# Patient Record
Sex: Male | Born: 1945 | Race: Black or African American | Hispanic: No | Marital: Single | Smoking: Former smoker
Health system: Southern US, Community
[De-identification: ages and names within clinical notes are randomized; demographics above are authoritative.]

## PROBLEM LIST (undated history)

## (undated) DIAGNOSIS — I2510 Atherosclerotic heart disease of native coronary artery without angina pectoris: Secondary | ICD-10-CM

## (undated) DIAGNOSIS — C189 Malignant neoplasm of colon, unspecified: Secondary | ICD-10-CM

## (undated) DIAGNOSIS — C20 Malignant neoplasm of rectum: Secondary | ICD-10-CM

## (undated) DIAGNOSIS — I10 Essential (primary) hypertension: Secondary | ICD-10-CM

## (undated) DIAGNOSIS — I4901 Ventricular fibrillation: Secondary | ICD-10-CM

## (undated) DIAGNOSIS — C61 Malignant neoplasm of prostate: Secondary | ICD-10-CM

## (undated) HISTORY — PX: KNEE ARTHROSCOPY: SHX127

---

## 2014-09-23 DIAGNOSIS — I4901 Ventricular fibrillation: Secondary | ICD-10-CM

## 2014-09-23 DIAGNOSIS — I2510 Atherosclerotic heart disease of native coronary artery without angina pectoris: Secondary | ICD-10-CM

## 2014-09-23 HISTORY — DX: Atherosclerotic heart disease of native coronary artery without angina pectoris: I25.10

## 2014-09-23 HISTORY — DX: Ventricular fibrillation: I49.01

## 2014-10-20 HISTORY — PX: CARDIAC CATHETERIZATION: SHX172

## 2014-10-25 HISTORY — PX: CARDIAC DEFIBRILLATOR PLACEMENT: SHX171

## 2015-02-23 LAB — POCT GLUCOSE (2 HR PP)

## 2015-06-12 LAB — POCT GLUCOSE (2 HR PP)

## 2015-09-08 ENCOUNTER — Telehealth: Payer: Self-pay | Admitting: *Deleted

## 2015-09-08 NOTE — Telephone Encounter (Signed)
Oncology Nurse Navigator Documentation  Oncology Nurse Navigator Flowsheets 09/08/2015  Navigator Location [facility]  Navigator Encounter Type Introductory phone call  Referral from medical oncologist in [place] will be moving to [place] to be with family to support him. His functional status has declined. Left VM for him to call back next week with approximate date he plans to move to area to coordinate his appointment for him.

## 2015-09-11 ENCOUNTER — Telehealth: Payer: Self-pay | Admitting: *Deleted

## 2015-09-11 NOTE — Telephone Encounter (Signed)
Oncology Nurse Navigator Documentation  Oncology Nurse Navigator Flowsheets 09/11/2015  Navigator Location [facility]  Navigator Encounter Type Introductory phone call;Telephone  Telephone Outgoing Call;Appt Confirmation/Clarification--left VM for sister, Camila Li with appointment with Dr. Burr Medico on 09/19/15 at 11:00. Requested return call to confirm and provide directions/what he needs to bring. Called [facility] and spoke with christine. Patient still in hospital, but discharge is planned for Friday this week. Dr. Marlyn Corporal wants to speak with oncologist who will be seeing him and his cell # was provided. Will forward to Dr. Burr Medico.

## 2015-09-11 NOTE — Telephone Encounter (Signed)
Return call from sister, Lorriane Shire. She spoke with his physician this weekend and when he is discharged from hospital will be going to SNF for 4-6 weeks and then come to [place]. She will call back when his move to [place] date is determined.Marland KitchenMarland KitchenMarland Kitchen

## 2015-09-13 ENCOUNTER — Telehealth: Payer: Self-pay | Admitting: Hematology

## 2015-09-13 ENCOUNTER — Encounter: Payer: Self-pay | Admitting: Hematology

## 2015-09-13 NOTE — Telephone Encounter (Signed)
Scheduled Pt per RN. Per RN, Pt is aware of appt time and date

## 2015-09-19 ENCOUNTER — Ambulatory Visit: Payer: Managed Care, Other (non HMO) | Admitting: Hematology

## 2015-10-03 ENCOUNTER — Telehealth: Payer: Self-pay | Admitting: *Deleted

## 2015-10-03 NOTE — Telephone Encounter (Signed)
  Oncology Nurse Navigator Documentation  Navigator Location: [facility] (10/03/15 1618) Navigator Encounter Type: Telephone (10/03/15 1618) Telephone: Outgoing Call;Patient Update (10/03/15 1618)  spoke w/his sister who was in MD office with him in [place]. He is still in SNF and still on IV antibiotics. Last day at SNF is 10/12/15 and then will be in his home a week or so before coming to [place]. She will call about 1 week prior to his move to [place] to be seen.

## 2015-10-16 ENCOUNTER — Telehealth: Payer: Self-pay | Admitting: *Deleted

## 2015-10-16 NOTE — Telephone Encounter (Signed)
Oncology Nurse Navigator Documentation  Oncology Nurse Navigator Flowsheets 10/16/2015  Navigator Location [facility]  Navigator Encounter Type Telephone  Telephone Incoming Call  Sister, Michael Wade called to report he is having his last MD appointment in [place] today. Plans to move to [facility] and live with her on 10/19/15. He will also need a PCP--she agrees to have him seen at [facility]. Informed her that navigator will help this referral go through for her. She is in process of getting his [place] [employer] transferred to [facility] [employer] do anything unless he is present to tell them it is OK to talk with her. Suggested she work on getting a Health are POA done when he comes in to be seen-will have CSW help with this. Informed her that navigator will call her with the new appointment. Will have him seen here 1st week of May. Sent fax to [facility] fax [phone] requesting records since last visit 09/05/15 be faxed here (prior records were received earlier).

## 2015-10-17 ENCOUNTER — Telehealth: Payer: Self-pay | Admitting: *Deleted

## 2015-10-17 NOTE — Telephone Encounter (Signed)
  Oncology Nurse Navigator Documentation  Navigator Location: [facility] (10/17/15 1114) Navigator Encounter Type: Telephone (10/17/15 1114) Telephone: Lahoma Crocker Call;Appt Confirmation/Clarification (10/17/15 1114)  Called sister, Marletta Lor with appointment with Dr. Benay Spice on 10/24/15 at 2 pm. Arrive at 1:45 pm. He saw his infectious disease physician on 4/24 and sees his PCP today. She has all his records with her and will bring on 10/24/15. Requested she obtain a CD of his last PET and CT scan to bring with her. She will need assistance getting him with an ID physician, cardiologist (asking for Peter [place]) and a new PCP with [facility]. Will send message to CSW regarding healthcare POA request when he is here next week.

## 2015-10-23 ENCOUNTER — Telehealth: Payer: Self-pay | Admitting: *Deleted

## 2015-10-23 NOTE — Telephone Encounter (Signed)
Call from sister requesting fax # for [facility] physician to fax his records. Provided [phone] to send records from [place].

## 2015-10-24 ENCOUNTER — Telehealth: Payer: Self-pay | Admitting: Oncology

## 2015-10-24 ENCOUNTER — Encounter: Payer: Self-pay | Admitting: Oncology

## 2015-10-24 ENCOUNTER — Ambulatory Visit (HOSPITAL_BASED_OUTPATIENT_CLINIC_OR_DEPARTMENT_OTHER): Payer: Managed Care, Other (non HMO) | Admitting: Oncology

## 2015-10-24 ENCOUNTER — Encounter: Payer: Self-pay | Admitting: *Deleted

## 2015-10-24 ENCOUNTER — Ambulatory Visit (HOSPITAL_BASED_OUTPATIENT_CLINIC_OR_DEPARTMENT_OTHER): Payer: Managed Care, Other (non HMO)

## 2015-10-24 VITALS — BP 118/70 | HR 103 | Temp 98.1°F | Resp 18 | Ht 71.0 in | Wt 112.5 lb

## 2015-10-24 DIAGNOSIS — C7801 Secondary malignant neoplasm of right lung: Secondary | ICD-10-CM

## 2015-10-24 DIAGNOSIS — C20 Malignant neoplasm of rectum: Secondary | ICD-10-CM

## 2015-10-24 DIAGNOSIS — C61 Malignant neoplasm of prostate: Secondary | ICD-10-CM | POA: Diagnosis not present

## 2015-10-24 DIAGNOSIS — I469 Cardiac arrest, cause unspecified: Secondary | ICD-10-CM

## 2015-10-24 DIAGNOSIS — C7800 Secondary malignant neoplasm of unspecified lung: Principal | ICD-10-CM

## 2015-10-24 DIAGNOSIS — I4901 Ventricular fibrillation: Secondary | ICD-10-CM

## 2015-10-24 DIAGNOSIS — I429 Cardiomyopathy, unspecified: Secondary | ICD-10-CM

## 2015-10-24 LAB — CBC WITH DIFFERENTIAL/PLATELET
BASO%: 0.9 % (ref 0.0–2.0)
BASOS ABS: 0.1 10*3/uL (ref 0.0–0.1)
EOS ABS: 0.8 10*3/uL — AB (ref 0.0–0.5)
EOS%: 7.9 % — AB (ref 0.0–7.0)
HEMATOCRIT: 32.1 % — AB (ref 38.4–49.9)
HEMOGLOBIN: 10.6 g/dL — AB (ref 13.0–17.1)
LYMPH#: 1.8 10*3/uL (ref 0.9–3.3)
LYMPH%: 17.7 % (ref 14.0–49.0)
MCH: 32.7 pg (ref 27.2–33.4)
MCHC: 33 g/dL (ref 32.0–36.0)
MCV: 99.1 fL — AB (ref 79.3–98.0)
MONO#: 0.8 10*3/uL (ref 0.1–0.9)
MONO%: 8.4 % (ref 0.0–14.0)
NEUT#: 6.5 10*3/uL (ref 1.5–6.5)
NEUT%: 65.1 % (ref 39.0–75.0)
PLATELETS: 323 10*3/uL (ref 140–400)
RBC: 3.24 10*6/uL — ABNORMAL LOW (ref 4.20–5.82)
RDW: 16.9 % — AB (ref 11.0–14.6)
WBC: 10 10*3/uL (ref 4.0–10.3)

## 2015-10-24 LAB — COMPREHENSIVE METABOLIC PANEL
ALBUMIN: 3.3 g/dL — AB (ref 3.5–5.0)
ALK PHOS: 156 U/L — AB (ref 40–150)
ALT: 14 U/L (ref 0–55)
AST: 23 U/L (ref 5–34)
Anion Gap: 9 mEq/L (ref 3–11)
BUN: 25.3 mg/dL (ref 7.0–26.0)
CALCIUM: 10 mg/dL (ref 8.4–10.4)
CHLORIDE: 106 meq/L (ref 98–109)
CO2: 24 mEq/L (ref 22–29)
Creatinine: 1.1 mg/dL (ref 0.7–1.3)
EGFR: 83 mL/min/{1.73_m2} — ABNORMAL LOW (ref >90)
Glucose: 89 mg/dl (ref 70–140)
POTASSIUM: 4.3 meq/L (ref 3.5–5.1)
Sodium: 139 mEq/L (ref 136–145)
Total Bilirubin: <0.3 mg/dL (ref 0.20–1.20)
Total Protein: 8.1 g/dL (ref 6.4–8.3)

## 2015-10-24 NOTE — Telephone Encounter (Signed)
per pof to sch pt appt-sent back ot lab-cld pt w/appt time & adv need to get contrast for scan

## 2015-10-24 NOTE — Progress Notes (Signed)
[facility] New Patient Consult   Referring MD: Lavella Lemons, [address] [place] [address]   Michael Wade 70 y.o.  1946-03-30    Reason for Referral:  Rectal cancer   HPI:  Michael Wade was diagnosed with metastatic rectal cancer in February 2016. A colonoscopy on 08/06/2014 revealed a circumferential mass extending from 7-18 cm from the anus and a biopsy confirmed invasive adenocarcinoma. A staging PET scan 09/30/2014 revealed an FDG avid upper rectal tumor including soft tissue  In the adjacent mesorectal fat, FDG avid [peritoneal lymph node suspicious for metastatic lymphadenopathy, and an FDG avid right upper lung groundglass nodule. A CT-guided biopsy of the right upper lung nodule on 10/12/2014 confirmed metastatic colorectal cancer.  He was treated with FOLFOX/Avastin for 12 cycles with oxaliplatin discontinued after cycle 10 due to neuropathy. A PET scan after 6 cycles on 02/23/2015 revealed a clinical response with near resolution of the right lung nodule and a local response. A restaging PET scan after cycle 12 revealed  Continued improvement in the rectal wall mass, hypermetabolic ridge peritoneal lymphadenopathy no longer seen, and hypermetabolic activity in the right upper lung nodule had resolved.   He declined surgery. Xeloda/radiation was recommended. He completed one cycle of Xeloda beginning 07/12/2015 and never began radiation.  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 He was seen by infectious disease and completed 6 weeks of vancomycin/ Zosyn, completed 10/14/2015. He remains on Flagyl. Back pain he experienced prior to the antibiotics has resolved.  He is also being treated for early stage prostate cancer with Lupron. He is receiving Lupron on a 3 month schedule, last given 08/07/2015.  Michael Wade  Has a history of coronary artery disease and had a ventricular fibrillation  arrest 10/19/2014. A defibrillator is in place. His ejection fraction is reported at 15%.  Michael Wade relocated to [facility]  On 10/19/2015 to live with his mother and sister.   He has been maintained off of specific therapy for rectal cancer since January.   Past medical history: 1.  Stage IV rectal cancer diagnosed in February 2016 2.   Coronary artery disease 3.   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   Diminished ejection fraction 5.    Hypertension 6.    Prostate cancer, 09/05/2014, Gleason 7, maintained on every three-month Lupron 7.    Chemotherapy-induced peripheral neuropathy 8.     Iron deficiency anemia 9.     History of leukocytosis 10.   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  Left knee surgery Medications: Reviewed  Allergies: No Known Allergies  Family history:  Is tolerating a colon cancer at age 56, paternal uncle had pancreas cancer in his 52s, his paternal grandmother had a GYN malignancy in her 12s  Social History:    he lives with his mother and sister in [place]. He quit smoking cigarettes a few years ago. He has a history of mild alcohol use. He was transfused with packed red blood cells with the rectal cancer diagnosis. No risk factor for HIV or hepatitis.    ROS:   Positives include: 20 pound weight loss over the past year, rectal bleeding, occasional headache, numbness in the fingers that interferes with activity, back pain resolved following antibiotics  A complete ROS  was otherwise negative.  Physical Exam:  Blood pressure 118/70, pulse 103, temperature 98.1 F (36.7 C), temperature source Oral, resp. rate 18, height 5\' 11"  (1.803 m), weight 112 lb 8 oz (51.03 kg), SpO2 100 %.  HEENT:  Oropharynx without visible mass, neck without mass Lungs:  Clear  bilaterally Cardiac:  Regular rate and rhythm, left upper chest defibrillator in place Abdomen:  No hepatomegaly, no mass, nontender GU:  Testes without mass  Vascular:  Leg edema Lymph nodes:  No cervical, supraclavicular, axillary, or inguinal nodes Neurologic:  Alert and oriented, the motor exam appears intact in the upper and lower extremities Skin:  Mild hyperpigmented eczema-like areas at the lower back Musculoskeletal:  No spine tenderness    Imaging:   as per history of present illness   Assessment/Plan:   1.  rectal cancer, stage IV, a lung metastasis and  Metastatic lymphadenopathy on initial staging), status post  A rectal biopsy 08/07/2015 , CEA 2.8 on 09/22/2014   colonoscopy 08/06/2014 confirmed a mass extending from 7-18 cm from the anus   MRI 08/19/2014 revealed a circumferential mass at the rectum, no definite extension beyond the serosal margin, no perirectal adenopathy   PET scan 09/30/2014 with an FDG avid upper rectal mass, extension into adjacent mesorectal fat versus mesorectal lymphadenopathy, FDG avid left retroperitoneal lymph nodes and an FDG avid  Right upper lung groundglass nodule measuring 1.9 x 1.1 cm.   CT-guided biopsy of the right lung nodule 10/12/2014 confirmed metastatic colorectal cancer   treatment with FOLFOX/Avastin for 12 cycles completed December 2016, oxaliplatin deleted after cycle 10 secondary to neuropathy   restaging PET scan 02/23/2015 after 6 cycles revealed local regional response and the right upper lobe nodule had almost completely resolved   PET scan 06/12/2015 after cycle 12 revealed continued improvement in the rectal mass, hypermetabolic Retroperitoneal lymphadenopathy no longer seen, hyper metabolic activity at right lung nodule had resolved   one cycle of Xeloda January 2017   2.      Prostate cancer, PSA 11.3 09/05/2014          Prostate biopsy 09/05/2014 confirmed a Gleason 7 tumor   maintained on every  three-month Lupron starting 11/23/2014    3.  Iron deficiency anemia  4.  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   History of CAD, EF 15%    6.    Hypertension 7.    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    Family history of colon and pancreas cancer    Disposition:    Michael Wade has metastatic rectal cancer. He has completed 12 cycles of FOLFOX/Avastin with a clinical response. He has been maintained off of specific therapy since January.  He continues to have rectal bleeding, but he is otherwise asymptomatic from the rectal cancer at present. He is completing antibiotic therapy for treatment of a lumbar epidural abscess.  I discussed treatment options with Michael Wade and his family. He could potentially undergo an attempt at curative therapy with chemotherapy/radiation, rectal surgery, and treatment directed at the isolated lung lesion. However the chance of cure is very small given the initial presentation with metastatic lymphadenopathy and a lung lesion. He is most likely not a surgical candidate based on his cardiac history.  The plan is to follow him off therapy for now. He will undergo restaging CT scans and return for an  office visit in 2 weeks. We will  decide on whether to continue Lupron when he returns in 2 weeks. I will present his case at the GI tumor conference.   we will make a referral to cardiology to establish care in [place].  Approximately 50 minutes were spent with the patient today. The majority of the time was used for counseling and coordination of care.  Betsy Coder, MD  10/24/2015, 3:30 PM

## 2015-10-24 NOTE — Progress Notes (Signed)
Oncology Nurse Navigator Documentation  Oncology Nurse Navigator Flowsheets 10/24/2015  Navigator Location [facility]  Navigator Encounter Type Initial MedOnc  Telephone -  Patient Visit Type MedOnc;Initial  Treatment Phase Other--transfer care from [place] to [facility]  Barriers/Navigation Needs Coordination of Care;Education  Education Accessing Care/ Finding Providers;Preparing for Upcoming Surgery/ Treatment  Interventions Referrals;Coordination of Care;Education Method  Referrals Social Work for [place] to be completed;Other--cardiology  Coordination of Care Radiology--fax ROI to hospitals in [place] for scans on CD  Education Method Verbal;Teach-back  Acuity Level 4  Time Spent with Patient 47  Escorted his sister to financial advocate to ensure his insurance is in order. Sent message to managed care to precert the CT scan so it can be ordered. Will add to GI Conference on 11/08/15 to compare scan here to priors in [place]. Noted last PAC flush was 10/14/15 with his last antibiotic dose and last Lupron injection was 08/07/15 (will be due on next May visit). Faxed ROI to [facility] [phone] for CT 08/06/14 of C/A/P; ROI to [facility] [phone] for CT 09/05/15 of L-spine; [facility] [phone] for PET scans 09/30/14 and 06/12/15 to be sent on CD to Dr. Benay Spice. Met with patient, his sister and his mother at new patient appointment today. Working on getting a CT scan and compare with last ones in [place], then present case in Tumor Board on 5/17. Will refer to Dr. Lisbeth Renshaw for RT if indicated. Made referral to cardiology.

## 2015-10-25 ENCOUNTER — Encounter: Payer: Self-pay | Admitting: *Deleted

## 2015-10-25 LAB — CEA: CEA1: 2 ng/mL (ref 0.0–4.7)

## 2015-10-25 NOTE — Progress Notes (Signed)
[place] Social Work  Clinical Social Work was referred by GI navigator to review and complete healthcare advance directives.  Clinical Social Worker met with patient, patient's sister, and patient's mother in [facility] lobby.  The patient designated sister, Michael Wade, as their primary healthcare agent and mother, Michael Wade, as their secondary agent.  Patient also completed healthcare living will.  Patient does not desire life prolonging measure in the end of life scenarios identified.  Advanced Directives 10/25/2015  Does patient have an advance directive? Yes  Type of Paramedic of [place];Living will  Does patient want to make changes to advanced directive? Yes - information given  Copy of advanced directive(s) in chart? Yes  Would patient like information on creating an advanced directive? Yes - Educational materials given    Clinical Social Worker notarized documents and made copies for patient/family. Clinical Social Worker will send documents to medical records to be scanned into patient's chart. Clinical Social Worker encouraged patient/family to contact with any additional questions or concerns.  Michael Wade, MSW, [place] Worker [facility] [phone]

## 2015-10-26 ENCOUNTER — Encounter: Payer: Self-pay | Admitting: *Deleted

## 2015-10-26 NOTE — Progress Notes (Signed)
  Oncology Nurse Navigator Documentation  Navigator Location: [facility] (10/26/15 1704) Navigator Encounter Type: Letter/Fax/Email (10/26/15 1704)  Received copy of CT C/A/P report dated 08/06/14 from [facility] along with CD. Will transport to radiology when other CD's are received.

## 2015-10-27 ENCOUNTER — Telehealth: Payer: Self-pay | Admitting: *Deleted

## 2015-10-27 NOTE — Telephone Encounter (Signed)
Called cardiology practice of [place]: They have referral and tried to call patient with no return call. Informed scheduler that they need to call the emergency contact, his sister who is his POA. Was informed by managed care at [facility] that patient's insurance will only approve his CT scan if done at [facility] PA says chest--managed care will f/u to ensure abd/pelvis are also approved. Will then reschedule his scan from [facility] to [facility].

## 2015-10-31 ENCOUNTER — Telehealth: Payer: Self-pay | Admitting: *Deleted

## 2015-10-31 NOTE — Telephone Encounter (Signed)
"  Someone called me in reference to my brother Michael Wade.  Not sure why but I do know he has an appointment coming up.  Could staff call me not him because he is sleep and doesn't know what's going on when he is awakened." Advised his CT scans need to be performed at [facility].  Managed care is working on prior authorizations and will call her with new appointment dates, times and location information.  Updated demographics for her to receive all calls.

## 2015-10-31 NOTE — Telephone Encounter (Signed)
[facility] again requesting they contact his sister for appointment. Representative said she will have Melissa call sister today.

## 2015-10-31 NOTE — Telephone Encounter (Signed)
Return call from [place]: Made her aware that his insurance plan requires him to go to [facility] and not use a [place] facility. Provided his scan appointment for 11/03/15 at 0730/0900-NPO 4 hours prior and they will provide contrast upon arrival. Address : [facility] [place] [address] [address] [phone] Fax [phone]. She will obtain a CD and bring to office on that Friday or Monday. They are to enter front main entrance, then turn left to patient registration. Faxed orders/lab results to [facility] with order to provide patient CD to have loaded in our system.

## 2015-11-01 ENCOUNTER — Inpatient Hospital Stay: Admission: RE | Admit: 2015-11-01 | Payer: Self-pay | Source: Ambulatory Visit

## 2015-11-01 ENCOUNTER — Inpatient Hospital Stay
Admission: RE | Admit: 2015-11-01 | Discharge: 2015-11-01 | Disposition: A | Discharge status: Home / Self Care | Payer: Self-pay | Source: Ambulatory Visit | Attending: Oncology | Admitting: Oncology

## 2015-11-01 ENCOUNTER — Other Ambulatory Visit: Payer: Self-pay | Admitting: Oncology

## 2015-11-01 DIAGNOSIS — C7800 Secondary malignant neoplasm of unspecified lung: Principal | ICD-10-CM

## 2015-11-01 DIAGNOSIS — C801 Malignant (primary) neoplasm, unspecified: Secondary | ICD-10-CM

## 2015-11-01 DIAGNOSIS — C20 Malignant neoplasm of rectum: Secondary | ICD-10-CM

## 2015-11-03 ENCOUNTER — Telehealth: Payer: Self-pay | Admitting: *Deleted

## 2015-11-03 NOTE — Telephone Encounter (Signed)
Oncology Nurse Navigator Documentation  Oncology Nurse Navigator Flowsheets 11/03/2015  Navigator Location [facility]  Navigator Encounter Type Telephone  Telephone Incoming Call;Diagnostic Results  Patient Visit Type -  Treatment Phase -  Barriers/Navigation Needs -  Education -  Interventions -  Referrals -  Coordination of Care -Sister has his CD from [facility] today and asking where to deliver it to? Instructed her to bring to [place] on Monday and give to reception desk to forward to the navigator. [place] CDs have been loaded into system and returned to [employer] today.  Education Method -  Acuity -  Time Spent with Patient 15

## 2015-11-06 ENCOUNTER — Other Ambulatory Visit: Payer: Self-pay | Admitting: Oncology

## 2015-11-06 ENCOUNTER — Inpatient Hospital Stay
Admission: RE | Admit: 2015-11-06 | Discharge: 2015-11-06 | Disposition: A | Discharge status: Home / Self Care | Payer: Self-pay | Source: Ambulatory Visit | Attending: Oncology | Admitting: Oncology

## 2015-11-06 DIAGNOSIS — C801 Malignant (primary) neoplasm, unspecified: Secondary | ICD-10-CM

## 2015-11-07 ENCOUNTER — Ambulatory Visit (HOSPITAL_COMMUNITY): Payer: Managed Care, Other (non HMO)

## 2015-11-08 ENCOUNTER — Telehealth: Payer: Self-pay | Admitting: Pharmacist

## 2015-11-08 ENCOUNTER — Telehealth: Payer: Self-pay | Admitting: Oncology

## 2015-11-08 ENCOUNTER — Inpatient Hospital Stay
Admission: RE | Admit: 2015-11-08 | Discharge: 2015-11-08 | Disposition: A | Discharge status: Home / Self Care | Payer: Self-pay | Source: Ambulatory Visit | Attending: Oncology | Admitting: Oncology

## 2015-11-08 ENCOUNTER — Other Ambulatory Visit: Payer: Self-pay | Admitting: Oncology

## 2015-11-08 ENCOUNTER — Ambulatory Visit (HOSPITAL_BASED_OUTPATIENT_CLINIC_OR_DEPARTMENT_OTHER): Payer: Managed Care, Other (non HMO) | Admitting: Oncology

## 2015-11-08 ENCOUNTER — Other Ambulatory Visit: Payer: Self-pay | Admitting: *Deleted

## 2015-11-08 ENCOUNTER — Telehealth: Payer: Self-pay | Admitting: *Deleted

## 2015-11-08 VITALS — BP 121/71 | HR 89 | Temp 98.5°F | Resp 16 | Ht 71.0 in | Wt 108.9 lb

## 2015-11-08 DIAGNOSIS — C7800 Secondary malignant neoplasm of unspecified lung: Secondary | ICD-10-CM

## 2015-11-08 DIAGNOSIS — C61 Malignant neoplasm of prostate: Secondary | ICD-10-CM

## 2015-11-08 DIAGNOSIS — D509 Iron deficiency anemia, unspecified: Secondary | ICD-10-CM | POA: Diagnosis not present

## 2015-11-08 DIAGNOSIS — C801 Malignant (primary) neoplasm, unspecified: Secondary | ICD-10-CM

## 2015-11-08 DIAGNOSIS — C20 Malignant neoplasm of rectum: Secondary | ICD-10-CM | POA: Diagnosis not present

## 2015-11-08 MED ORDER — MEGESTROL ACETATE 625 MG/5ML PO SUSP: 625.0000 mg | Freq: Two times a day (BID) | ORAL | Status: DC

## 2015-11-08 MED ORDER — CAPECITABINE 500 MG PO TABS: ORAL_TABLET | ORAL | Status: DC

## 2015-11-08 NOTE — Telephone Encounter (Signed)
per pof to sch pt appt-gave pt copy of avs °

## 2015-11-08 NOTE — Telephone Encounter (Signed)
Oncology Nurse Navigator Documentation  Oncology Nurse Navigator Flowsheets 11/08/2015  Navigator Location [facility]  Navigator Encounter Type Follow-up Appt  Telephone -  Patient Visit Type MedOnc  Treatment Phase Pre-Tx/Tx Discussion--Xeloda/RT  Barriers/Navigation Needs Education;Coordination of Care  Education Understanding Cancer/ Treatment Options;Pain/ Symptom Management;Preparing for Upcoming Surgery/ Treatment  Interventions Referrals;Coordination of Care;Education Method  Referrals -  Coordination of Care Appts--message to Dr. Ida Rogue scheduler to request appointment early next week-5/24 if possible (day of chemo class)  Education Method Verbal;Written;Teach-back  Acuity Level 2  Time Spent with Patient 10  Explained the physiology behind why he is having loose stools due to his rectal tumor is causing some blockage and looser stool is going around the obstruction. CT scan shows a moderate stool burden in colon. He will start MiraLax 17 grams daily. Also started on Megace bid for appetite-was informed of increase blood clot risk with this medication. MD will resume his Lupron when he comes in for chemo class next week. [facility] to request hard copy of the CT report. CD has been loaded into system. Patient's case was discussed in tumor board today with recommendations for XRT/Xeloda. High surgical risk due to cardiac issues. Would need cardiac clearance prior to surgery.

## 2015-11-08 NOTE — Progress Notes (Signed)
[facility] OFFICE PROGRESS NOTE   Diagnosis: Rectal cancer, prostate cancer  INTERVAL HISTORY:   Michael Wade returns as scheduled. He has intermittent abdominal pain. He is not taking pain medication. He reports anorexia. He has frequent loose stools. He reports having up to 5 stools during the night with some incontinence. CTs the chest, abdomen, and pelvis on 11/03/2015 No vomiting of revealed clustered peripheral right upper lobe nodules, subcentimeter low attenuation lesions in the liver consistent with cyst. A large obstructing mass was noted at the rectum extending into the presacral soft tissue with potential for a small associated abscess. Severe fecal retention in the proximal colon. No adenopathy. No evidence of metastatic disease in the abdomen or pelvis.  Objective:  Vital signs in last 24 hours:  Blood pressure 121/71, pulse 89, temperature 98.5 F (36.9 C), temperature source Oral, resp. rate 16, height 5\' 11"  (1.803 m), weight 108 lb 14.4 oz (49.397 kg), SpO2 100 %.    Resp: Lungs clear bilaterally Cardio: Regular rate and rhythm GI: Nondistended, nontender, no mass, no hepatomegaly Vascular: No leg edema   Portacath/PICC-without erythema  Lab Results:  Lab Results  Component Value Date   WBC 10.0 10/24/2015   HGB 10.6* 10/24/2015   HCT 32.1* 10/24/2015   MCV 99.1* 10/24/2015   PLT 323 10/24/2015   NEUTROABS 6.5 10/24/2015     Lab Results  Component Value Date   CEA1 2.0 10/24/2015    Imaging:  As per history of present illness, images reviewed at the GI tumor conference 11/08/2015   Medications: I have reviewed the patient's current medications.  Assessment/Plan: 1. rectal cancer, stage IV, a lung metastasis and Metastatic lymphadenopathy on initial staging), status post A rectal biopsy 08/07/2015 , CEA 2.8 on 09/22/2014  colonoscopy 08/06/2014 confirmed a mass extending from 7-18 cm from the anus  MRI 08/19/2014 revealed a  circumferential mass at the rectum, no definite extension beyond the serosal margin, no perirectal adenopathy  PET scan 09/30/2014 with an FDG avid upper rectal mass, extension into adjacent mesorectal fat versus mesorectal lymphadenopathy, FDG avid left retroperitoneal lymph nodes and an FDG avid Right upper lung groundglass nodule measuring 1.9 x 1.1 cm.  CT-guided biopsy of the right lung nodule 10/12/2014 confirmed metastatic colorectal cancer  treatment with FOLFOX/Avastin for 12 cycles completed December 2016, oxaliplatin deleted after cycle 10 secondary to neuropathy  restaging PET scan 02/23/2015 after 6 cycles revealed local regional response and the right upper lobe nodule had almost completely resolved  PET scan 06/12/2015 after cycle 12 revealed continued improvement in the rectal mass, hypermetabolic Retroperitoneal lymphadenopathy no longer seen, hyper metabolic activity at right lung nodule had resolved  one cycle of Xeloda January 2017  CTs chest, abdomen, and pelvis 11/03/2015-clustered peripheral right upper lobe nodules, no other evidence of metastatic disease, rectal mass with large colonic stool burden, 1.6 and meter presacral rim-enhancing fluid collection   2. Prostate cancer, PSA 11.3 09/05/2014   Prostate biopsy 09/05/2014 confirmed a Gleason 7 tumor  maintained on every three-month Lupron starting 11/23/2014   3. Iron deficiency anemia  4. 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 History of CAD, EF 15%  6. Hypertension 7. 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 Family history of colon and pancreas cancer     Disposition:  Mr. [name] appears stable. His case was presented at the GI tumor conference  earlier today. The consensus opinion is to proceed with  radiation/Xeloda for treatment of the primary tumor and consider SRS to the biopsy-proven lung metastasis.  He will most likely not be a candidate for resection of the primary tumor secondary to his cardiac history and underlying emphysema.  I reviewed the potential toxicities associated with Xeloda including a chance for nausea, mucositis, diarrhea, rash, hyperpigmentation, sun sensitivity, cardiac toxicity, and hand/foot syndrome. He agrees to proceed.  Mr. [name] will be referred to Dr. Lisbeth Renshaw for radiation planning. We anticipate he will start treatment on 11/21/2015.  He will return for an office and lab visit in approximately 2 weeks after starting therapy.  Mr. [name] is scheduled to see cardiology 11/24/2015. He will begin Megace as an appetite stimulant. He will begin MiraLAX and an attempt to regulate his bowel habits.  Mr. [name] will continue every three-month Lupron for treatment of the prostate cancer.  Betsy Coder, MD  11/08/2015  12:10 PM

## 2015-11-08 NOTE — Telephone Encounter (Signed)
11/08/15: New Rx for Xeloda faxed to [facility]

## 2015-11-08 NOTE — Telephone Encounter (Signed)
Pt insurance requires an autho, contacted referring provider in [place] and was provided the PCP in [place] (Dr. Linward Foster [phone] lt mess for referral dept.

## 2015-11-08 NOTE — Patient Instructions (Signed)
Your colon has a lot of stool per CT scan. Start taking MiraLax 17 grams daily. Start Megace 5 cc twice daily for appetite Return next week for chemo class and to see Dr. Lisbeth Renshaw for radiation therapy planning

## 2015-11-09 ENCOUNTER — Other Ambulatory Visit: Payer: Self-pay | Admitting: *Deleted

## 2015-11-09 ENCOUNTER — Encounter: Payer: Self-pay | Admitting: Pharmacist

## 2015-11-09 ENCOUNTER — Telehealth: Payer: Self-pay | Admitting: Oncology

## 2015-11-09 MED ORDER — MEGESTROL ACETATE 400 MG/10ML PO SUSP: 200.0000 mg | Freq: Two times a day (BID) | ORAL | Status: DC

## 2015-11-09 NOTE — Progress Notes (Signed)
Needed to move his chemo class from 5/24 due to being seen by Dr. Lisbeth Renshaw. POF to scheduler to have class and Lupron injection on 5/25 or 5/26. Requested appointment be called to his sister.

## 2015-11-09 NOTE — Progress Notes (Signed)
11/09/15: New Rx for Xeloda faxed to [facility] per insurance requirements. Johny Drilling, PharmD

## 2015-11-09 NOTE — Telephone Encounter (Signed)
Left message to inform patient of 5/25 appt at 10 am. Was unable to reach his sister or leave a message

## 2015-11-09 NOTE — Telephone Encounter (Signed)
Received call from April, Pharmacist at [facility]: Megace ES requires prior auth. Per MD: Rx should be for Megace 40mg /mL take 5 ml BID. New Rx sent electronically. April made aware, she will run new prescription.

## 2015-11-10 ENCOUNTER — Telehealth: Payer: Self-pay

## 2015-11-10 NOTE — Telephone Encounter (Signed)
[employer] prior authorization dept called with 2 questions concerning the megace prescription. 1) is the rx for aids, cancer or cystic fibrosis. This rn answered d/t cancer. 2) has the pt tried dronabinol? No note seen for prior dronabinol. Christella Scheuermann will send information to be considered. The order reference number is [id number].  There can be a 72 hour turn around time.

## 2015-11-10 NOTE — Progress Notes (Signed)
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 Loss  Over past year , 7 lbs in the last week, appetite poor but has started to improve with megace  Bowel/Bladder complaints, if any: bladder normal, blood in stool ,diarrhea  Nausea / Vomiting, if any: No  Pain issues, if any: abdomen 3/4 on 10 scale  Any blood per rectum:   Yes, having diarrhea but to take miralax  Has blockage  SAFETY ISSUES: Yes, unsteady  Falls  Recently severals times last month   Prior radiation? No  Pacemaker/ICD? 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

## 2015-11-15 ENCOUNTER — Telehealth: Payer: Self-pay

## 2015-11-15 ENCOUNTER — Other Ambulatory Visit: Payer: Managed Care, Other (non HMO)

## 2015-11-15 ENCOUNTER — Encounter: Payer: Self-pay | Admitting: Oncology

## 2015-11-15 ENCOUNTER — Ambulatory Visit
Admission: RE | Admit: 2015-11-15 | Discharge: 2015-11-15 | Disposition: A | Discharge status: Home / Self Care | Payer: Medicare (Managed Care) | Source: Ambulatory Visit | Attending: Radiation Oncology | Admitting: Radiation Oncology

## 2015-11-15 ENCOUNTER — Encounter: Payer: Self-pay | Admitting: Skilled Nursing Facility1

## 2015-11-15 ENCOUNTER — Other Ambulatory Visit: Payer: Self-pay | Admitting: *Deleted

## 2015-11-15 ENCOUNTER — Encounter: Payer: Self-pay | Admitting: Radiation Oncology

## 2015-11-15 VITALS — BP 110/76 | HR 97 | Temp 98.3°F | Resp 20 | Ht 71.0 in | Wt 101.7 lb

## 2015-11-15 DIAGNOSIS — I2510 Atherosclerotic heart disease of native coronary artery without angina pectoris: Secondary | ICD-10-CM | POA: Insufficient documentation

## 2015-11-15 DIAGNOSIS — C7800 Secondary malignant neoplasm of unspecified lung: Secondary | ICD-10-CM

## 2015-11-15 DIAGNOSIS — I10 Essential (primary) hypertension: Secondary | ICD-10-CM | POA: Insufficient documentation

## 2015-11-15 DIAGNOSIS — Z510 Encounter for antineoplastic radiation therapy: Secondary | ICD-10-CM | POA: Insufficient documentation

## 2015-11-15 DIAGNOSIS — Z87891 Personal history of nicotine dependence: Secondary | ICD-10-CM | POA: Insufficient documentation

## 2015-11-15 DIAGNOSIS — C20 Malignant neoplasm of rectum: Secondary | ICD-10-CM | POA: Insufficient documentation

## 2015-11-15 DIAGNOSIS — C61 Malignant neoplasm of prostate: Secondary | ICD-10-CM | POA: Diagnosis not present

## 2015-11-15 DIAGNOSIS — I4901 Ventricular fibrillation: Secondary | ICD-10-CM | POA: Diagnosis not present

## 2015-11-15 HISTORY — DX: Essential (primary) hypertension: I10

## 2015-11-15 HISTORY — DX: Ventricular fibrillation: I49.01

## 2015-11-15 HISTORY — DX: Atherosclerotic heart disease of native coronary artery without angina pectoris: I25.10

## 2015-11-15 HISTORY — DX: Malignant neoplasm of prostate: C61

## 2015-11-15 HISTORY — DX: Malignant neoplasm of rectum: C20

## 2015-11-15 NOTE — Progress Notes (Signed)
Radiation Oncology         [phone] ________________________________  Name: Michael Wade MRN: [id number]  Date: 11/15/2015  DOB: 1946-05-02  CC:Pcp Not In System  Ladell Pier, MD     REFERRING PHYSICIAN: Ladell Pier, MD   DIAGNOSIS: The primary encounter diagnosis was Rectal cancer metastasized to lung [facility]). A diagnosis of Prostate cancer [facility]) was also pertinent to this visit.    HISTORY OF PRESENT ILLNESS:Michael Wade is a 70 y.o. male who is seen at the request of Dr. Benay Spice for a history of rectal cancer. Apparently the patient was diagnosed with Stage IV disease in February of 2017 on after undergoing a screening colonoscopy and subsequent scans. His pathology was an adenocarcinoma, and at the time of his initial treatment, he was found to have disease in the retroperitoneal lymph nodes as well as biopsy confirmed disease in the lungs. His CEA at that time was 2.8. He received FOLFOX/Avastin for 12 cycles which he completed in December 2016. He also received Oxaliplatin for a total of 10 cycles at that time, but this was discontinued due to neuropathy. He was also scheduled to undergo radiotherapy, he did move forward with simulation, however did not proceed with ttreatment. His restaging PET scan in September 2016 revealed local regional response with complete resolution of his right upper lobe lesion. His PET scan after completion of chemotherapy revealed continued improvement of the rectal mass with resolution of the retroperitoneal adenopathy and lung lesion. Also in the midst of his diagnosis, he was found to have a PSA of 11.3 in March 2016. Subsequent biopsy revealed an intermediate risk gleason 3+4 adenocarcinoma. He was started on Lupron Q 3 months on 06/012016, and his last injection appears to have been on 08/07/15. He relocated from [place] to [place] in April 2017 to be closer to his sister who could help care for him.  Upon establishing care with Dr. Benay Spice,  a staging CT in May 2017 revealed a cluster of peripheral right upper lobe nodules, his persistent rectal mass with large stool burden, and a 1.6 cm presacral fluid collection. He comes today for further discussion of beginning radiotherapy with Xeloda to treat his primary tumor and to discuss the role of SRS to the lung metastases. Of note, he has an ICD which was placed on 10/19/2014 after an episode of Ventricular fibrillation and will be seeing Dr. Caryl Comes here in [place] on 11/24/2015.   PREVIOUS RADIATION THERAPY: No   PAST MEDICAL HISTORY:  Past Medical History  Diagnosis Date  . 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 Prostate cancer ([place])   . Rectal cancer ([place])   . CAD (coronary artery disease)   . HTN (hypertension)       PAST SURGICAL HISTORY: Past Surgical History  Procedure Laterality Date  . Left knee surgery    . Cardiac defibrillator placement       FAMILY HISTORY: family history includes Prostate cancer in his father.   SOCIAL HISTORY:  reports that he has quit smoking. His smoking use included Cigarettes. He has never used smokeless tobacco. He reports that he does not drink alcohol or use illicit drugs. 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   ALLERGIES: Review of patient's allergies indicates no known allergies.   MEDICATIONS:  Current Outpatient Prescriptions  Medication Sig Dispense Refill  . carvedilol (COREG) 6.25 MG tablet Take 6.25 mg by mouth daily.    . DULoxetine (CYMBALTA) 30 MG capsule Take 30  mg by mouth daily.    . ferrous sulfate 325 (65 FE) MG tablet Take 325 mg by mouth daily with breakfast.    . furosemide (LASIX) 40 MG tablet Take 40 mg by mouth daily.    Marland Kitchen HYDROcodone-acetaminophen (NORCO/VICODIN) 5-325 MG tablet Take 1 tablet by mouth every 4 (four) hours as needed for moderate pain.    . megestrol (MEGACE) 400 MG/10ML suspension Take 5 mLs (200 mg total) by mouth 2 (two) times daily. 300 mL 0  . metroNIDAZOLE  (FLAGYL) 500 MG tablet Take 500 mg by mouth 3 (three) times daily.    . pantoprazole (PROTONIX) 40 MG tablet Take 40 mg by mouth daily.    . Polyethylene Glycol 3350 (MIRALAX PO) Take 17 g by mouth daily.    Marland Kitchen pyridOXINE (B-6) 50 MG tablet Take 50 mg by mouth daily.    . capecitabine (XELODA) 500 MG tablet Take 3 tablets (1,500 mg) QAM 2 tabs (1,000 mg) QPM on days of radiation only (Mon-Fri) (Patient not taking: Reported on 11/15/2015) 140 tablet 0   No current facility-administered medications for this encounter.    REVIEW OF SYSTEMS:  On review of systems, the patient reports a poor appetite which he states has started to improve with Megace. He has had a 30 lb weight loss over the past year including a 7 pound weight loss within the last week. He reports diarrhea as well as blood in his stool. He states that he has diarrhea around 5 times per night. He reports taking Miralax when he does have a blockage. He reports pain in the abdomen at a 3 or 4 out of 10. He denies any pain when having a bowel movement. He denies any nausea, fever, chills, vomiting, chest pain, shortness of breath, melena, or bladder issues. He does state that he has had episodes of SOB in the past. He is very unsteady and has fallen several times in the past month. A complete review of systems was obtained and is otherwise negative.    PHYSICAL EXAM:  height is 5\' 11"  (1.803 m) and weight is 101 lb 11.2 oz (46.131 kg). His oral temperature is 98.3 F (36.8 C). His blood pressure is 110/76 and his pulse is 97. His respiration is 20 and oxygen saturation is 100%.    In general this is a chronically ill appearing African American male in no acute distress. He is alert and oriented x4 and appropriate throughout the examination. HEENT reveals that the patient is normocephalic, atraumatic. EOMs are intact. PERRLA. Skin is intact without any evidence of gross lesions. Cardiovascular exam reveals a regular rate and rhythm, no clicks rubs  or murmurs are auscultated. Chest is clear to auscultation bilaterally. Lymphatic assessment is performed and does not reveal any adenopathy in the cervical, supraclavicular, axillary, or inguinal chains. Abdomen has active bowel sounds in all quadrants and is intact. The abdomen is soft, non tender, non distended. Lower extremities are negative for pretibial pitting edema, deep calf tenderness, cyanosis or clubbing.   ECOG = 2  0 - Asymptomatic (Fully active, able to carry on all predisease activities without restriction)  1 - Symptomatic but completely ambulatory (Restricted in physically strenuous activity but ambulatory and able to carry out work of a light or sedentary nature. For example, light housework, office work)  2 - Symptomatic, <50% in bed during the day (Ambulatory and capable of all self care but unable to carry out any work activities. Up and about more than 50%  of waking hours)  3 - Symptomatic, >50% in bed, but not bedbound (Capable of only limited self-care, confined to bed or chair 50% or more of waking hours)  4 - Bedbound (Completely disabled. Cannot carry on any self-care. Totally confined to bed or chair)  5 - Death   Eustace Pen MM, Creech RH, Tormey DC, et al. [phone]). "Toxicity and response criteria of the [place] [employer]". [place] Oncol. 5 (6): 649-55  LABORATORY DATA:  Lab Results  Component Value Date   WBC 10.0 10/24/2015   HGB 10.6* 10/24/2015   HCT 32.1* 10/24/2015   MCV 99.1* 10/24/2015   PLT 323 10/24/2015   Lab Results  Component Value Date   NA 139 10/24/2015   K 4.3 10/24/2015   CO2 24 10/24/2015   Lab Results  Component Value Date   ALT 14 10/24/2015   AST 23 10/24/2015   ALKPHOS 156* 10/24/2015   BILITOT <0.30 10/24/2015      RADIOGRAPHY: Ct Outside Films Body  11/06/2015  CLINICAL DATA:  This exam is stored here for comparison purposes only and was performed at an outside facility.   Please contact the originating  institution for any associated interpretation or report.   IMPRESSION: Stage IV adenocarcinoma of the rectum with metastasis to the right upper lobe, and intermediate risk gleason's 3+4 adenocarcinoma of the prostate.  PLAN: We discussed the rationale for radiotherapy with a total of 33 fractions treatment over 6 weeks to the rectum along with Xeloda. We will plan to include the prostate in this treatment field as well and will refer him also to a local urologist. We also discussed the potential risks, benefits, long and short term side effects of this treatment. We will discuss potentially treating his lung lesion once we begin his radiation to the rectum. The patient has signed informed consent and is prepared to proceed with radiation. CT simulation is scheduled for 11/21/2015 at 9:00 AM.   Dr. Lisbeth Renshaw spent 60 minutes face to face with the patient and more than 50% of that time was spent in counseling and/or coordination of care.    The above documentation reflects my direct findings during this shared patient visit. Please see the separate note by Dr. Lisbeth Renshaw on this date for the remainder of the patient's plan of care.    Carola Rhine, PAC  This document serves as a record of services personally performed by Shona Simpson, PA and Kyung Rudd, MD. It was created on their behalf by Jenell Milliner, a trained medical scribe. The creation of this record is based on the scribe's personal observations and the provider's statements to them. This document has been checked and approved by the attending provider.

## 2015-11-15 NOTE — Progress Notes (Signed)
[employer] approved xeloda 11/14/15-01/14/16; rep # [id number]

## 2015-11-15 NOTE — Progress Notes (Signed)
Please see the Nurse Progress Note in the MD Initial Consult Encounter for this patient. 

## 2015-11-15 NOTE — Telephone Encounter (Signed)
Sister called with scheduling question. Pt has chemo education and injection on 5/26, she is asking if they can be rescheduled to 5/30 with pt's XRT simulation. Their mother is having surgery on 5/26.

## 2015-11-15 NOTE — Progress Notes (Signed)
Subjective:     Patient ID: Michael Wade, male   DOB: May 31, 1946, 70 y.o.   MRN: [id number]  HPI   Review of Systems     Objective:   Physical Exam To assist the pt in identifying dietary strategies to gain lost wt.     Assessment:     Pt identified as being malnourished due to lost wt. Pt contacted via the telephone at [phone]. Pt was unavailable.     Plan:     Dietitian left a message prompting the pt to contact [place] at [phone]

## 2015-11-16 ENCOUNTER — Other Ambulatory Visit: Payer: Self-pay | Admitting: *Deleted

## 2015-11-16 ENCOUNTER — Encounter: Payer: Self-pay | Admitting: Oncology

## 2015-11-16 ENCOUNTER — Telehealth: Payer: Self-pay | Admitting: Pharmacist

## 2015-11-16 ENCOUNTER — Telehealth: Payer: Self-pay | Admitting: Oncology

## 2015-11-16 ENCOUNTER — Ambulatory Visit: Payer: Managed Care, Other (non HMO)

## 2015-11-16 ENCOUNTER — Other Ambulatory Visit: Payer: Managed Care, Other (non HMO)

## 2015-11-16 NOTE — Telephone Encounter (Signed)
Per Dr. Benay Spice, [place] to move injection and chemo education class to 11/21/15 per pt request.  Call placed to pt.'s sister Camila Li to inform her of above information.  POF sent.

## 2015-11-16 NOTE — Telephone Encounter (Signed)
Received multiple VM (from 5/23 and 5/24) left on Oral Oncology Clinic line from [place] for clarification of days supply and # of tablets needed for Xeloda Rx.  Clarified that 28 day supply is needed to complete 5&1/2 weeks of xrt. Xeloda is taken M-F for 5 & 1/2 weeks. Quantity needed is 140 tablets. Per Santiago Glad, [facility] will dispense 70 tablets (19 day supply) with one refill for the remainder of the prescription. Phone number to Santiago Glad = [phone], Ext:[phone]  Pt has been approved for $0 copay.  No confirmed date on drug shipment at this time. Should be "fairly quick" per Santiago Glad. Pt scheduled to start xrt on 11/21/15.  Raul Del, PharmD, BCPS, [facility] [phone]

## 2015-11-16 NOTE — Telephone Encounter (Signed)
spoke w/ pt sister confirmed new apts on 5/30

## 2015-11-16 NOTE — Progress Notes (Signed)
Per [employer]  Megestrol approved 11/09/15-11/08/16  Event# [id number] I sent to medical records

## 2015-11-17 ENCOUNTER — Telehealth: Payer: Self-pay | Admitting: *Deleted

## 2015-11-17 ENCOUNTER — Other Ambulatory Visit: Payer: Managed Care, Other (non HMO)

## 2015-11-17 ENCOUNTER — Ambulatory Visit: Payer: Managed Care, Other (non HMO)

## 2015-11-17 NOTE — Telephone Encounter (Signed)
CALLED PATIENT TO INFORM OF APPT. WITH DR. Arnette Norris. [place] ON 01-08-16- ARRIVAL TIME - 8:45 AM, SPOKE WITH PATIENT AND HE IS AWARE OF THIS APPT.

## 2015-11-21 ENCOUNTER — Encounter: Payer: Self-pay | Admitting: *Deleted

## 2015-11-21 ENCOUNTER — Ambulatory Visit (HOSPITAL_BASED_OUTPATIENT_CLINIC_OR_DEPARTMENT_OTHER): Payer: Managed Care, Other (non HMO)

## 2015-11-21 ENCOUNTER — Telehealth: Payer: Self-pay | Admitting: Pharmacist

## 2015-11-21 ENCOUNTER — Ambulatory Visit: Payer: Managed Care, Other (non HMO)

## 2015-11-21 ENCOUNTER — Ambulatory Visit
Admission: RE | Admit: 2015-11-21 | Discharge: 2015-11-21 | Disposition: A | Discharge status: Home / Self Care | Payer: Medicare (Managed Care) | Source: Ambulatory Visit | Attending: Radiation Oncology | Admitting: Radiation Oncology

## 2015-11-21 ENCOUNTER — Encounter: Payer: Self-pay | Admitting: Oncology

## 2015-11-21 VITALS — BP 138/75 | HR 74 | Temp 98.8°F | Resp 20

## 2015-11-21 DIAGNOSIS — C61 Malignant neoplasm of prostate: Secondary | ICD-10-CM

## 2015-11-21 DIAGNOSIS — Z510 Encounter for antineoplastic radiation therapy: Secondary | ICD-10-CM | POA: Diagnosis not present

## 2015-11-21 DIAGNOSIS — Z5111 Encounter for antineoplastic chemotherapy: Secondary | ICD-10-CM | POA: Diagnosis not present

## 2015-11-21 MED ORDER — LEUPROLIDE ACETATE (3 MONTH) 22.5 MG IM KIT
22.5000 mg | PACK | Freq: Once | INTRAMUSCULAR | Status: AC
  Administered 2015-11-21: 22.5 mg via INTRAMUSCULAR
  Filled 2015-11-21: qty 22.5

## 2015-11-21 NOTE — Patient Instructions (Signed)
Leuprolide depot injection What is this medicine? LEUPROLIDE (loo PROE lide) is a man-made protein that acts like a natural hormone in the body. It decreases testosterone in men and decreases estrogen in women. In men, this medicine is used to treat advanced prostate cancer. In women, some forms of this medicine may be used to treat endometriosis, uterine fibroids, or other male hormone-related problems. This medicine may be used for other purposes; ask your health care provider or pharmacist if you have questions. What should I tell my health care provider before I take this medicine? They need to know if you have any of these conditions: -diabetes -heart disease or previous heart attack -high blood pressure -high cholesterol -osteoporosis -pain or difficulty passing urine -spinal cord metastasis -stroke -tobacco smoker -unusual vaginal bleeding (women) -an unusual or allergic reaction to leuprolide, benzyl alcohol, other medicines, foods, dyes, or preservatives -pregnant or trying to get pregnant -breast-feeding How should I use this medicine? This medicine is for injection into a muscle or for injection under the skin. It is given by a health care professional in a hospital or clinic setting. The specific product will determine how it will be given to you. Make sure you understand which product you receive and how often you will receive it. Talk to your pediatrician regarding the use of this medicine in children. Special care may be needed. Overdosage: If you think you have taken too much of this medicine contact a poison control center or emergency room at once. NOTE: This medicine is only for you. Do not share this medicine with others. What if I miss a dose? It is important not to miss a dose. Call your doctor or health care professional if you are unable to keep an appointment. Depot injections: Depot injections are given either once-monthly, every 12 weeks, every 16 weeks, or  every 24 weeks depending on the product you are prescribed. The product you are prescribed will be based on if you are male or male, and your condition. Make sure you understand your product and dosing. What may interact with this medicine? Do not take this medicine with any of the following medications: -chasteberry This medicine may also interact with the following medications: -herbal or dietary supplements, like black cohosh or DHEA -male hormones, like estrogens or progestins and birth control pills, patches, rings, or injections -male hormones, like testosterone This list may not describe all possible interactions. Give your health care provider a list of all the medicines, herbs, non-prescription drugs, or dietary supplements you use. Also tell them if you smoke, drink alcohol, or use illegal drugs. Some items may interact with your medicine. What should I watch for while using this medicine? Visit your doctor or health care professional for regular checks on your progress. During the first weeks of treatment, your symptoms may get worse, but then will improve as you continue your treatment. You may get hot flashes, increased bone pain, increased difficulty passing urine, or an aggravation of nerve symptoms. Discuss these effects with your doctor or health care professional, some of them may improve with continued use of this medicine. Male patients may experience a menstrual cycle or spotting during the first months of therapy with this medicine. If this continues, contact your doctor or health care professional. What side effects may I notice from receiving this medicine? Side effects that you should report to your doctor or health care professional as soon as possible: -allergic reactions like skin rash, itching or hives, swelling of the   face, lips, or tongue -breathing problems -chest pain -depression or memory disorders -pain in your legs or groin -pain at site where injected or  implanted -severe headache -swelling of the feet and legs -visual changes -vomiting Side effects that usually do not require medical attention (report to your doctor or health care professional if they continue or are bothersome): -breast swelling or tenderness -decrease in sex drive or performance -diarrhea -hot flashes -loss of appetite -muscle, joint, or bone pains -nausea -redness or irritation at site where injected or implanted -skin problems or acne This list may not describe all possible side effects. Call your doctor for medical advice about side effects. You may report side effects to FDA at 1-800-FDA-1088. Where should I keep my medicine? This drug is given in a hospital or clinic and will not be stored at home. NOTE: This sheet is a summary. It may not cover all possible information. If you have questions about this medicine, talk to your doctor, pharmacist, or health care provider.    2016, Elsevier/Gold Standard. (2014-03-04 14:16:23)  

## 2015-11-21 NOTE — Progress Notes (Signed)
Introduced myself as one of his FA.  Informed him of the [place] gave him an expense sheet.  Since he is a radiation pt he will bring his bank statement to [facility]ilene Ravel or [place] on 11/28/15 to see if he qualifies for the grant.  I informed Ailene Ravel of this.

## 2015-11-21 NOTE — Progress Notes (Signed)
Patient signed consent for xeloda.

## 2015-11-21 NOTE — Addendum Note (Signed)
Encounter addended by: Kyung Rudd, MD on: 11/21/2015  4:25 PM<BR>     Documentation filed: Follow-up Section, LOS Section

## 2015-11-21 NOTE — Telephone Encounter (Signed)
Xeloda rx was delivered today. Lorriane Shire (pt sister) confirmed receipt.

## 2015-11-21 NOTE — Telephone Encounter (Addendum)
Received fax from [facility] stating the Xeloda Rx shipped on 11/17/15 with an expected date of delivery of 11/21/15. Tracking #: [id number]  Pt had chemo education class today. Will confirm receipt of medication on 11/24/15. Pt starts XRT next week.  Pt requests refill on Protonix from outside MD. There is an interaction between Protonix and Xeloda with a possible decreased Xeloda absorption due to pH alteration. Dr. Benay Spice aware and ok to proceed.  Dr. Gearldine Shown RN to call protonix rx to pt pharmacy. No need for home PRN antiemetic rx at this time per MD.  Oral Chemotherapy Pharmacist Encounter I spoke with patient for overview of new oral chemotherapy medication: Xeloda.  Pt has had previous chemotherapy in [place].  Counseled patient on administration, dosing, side effects, safe handling, and monitoring. Side effects include but not limited to: diarrhea, fatigue, nausea, mouth sores, and hand/foot syndrome. He knows to take his medication with water and 30 minutes after his morning (3 tablets) and evening meals (2 tablets). Mr. Moes voiced understanding and appreciation.   All questions answered.  Will follow up later this week to confirm receipt of medication. Will f/u in 2 weeks (one week after the start of Xeloda and XRT) for side effect management.  Thank you,  Raul Del, PharmD, [place], [facility] [phone]

## 2015-11-22 ENCOUNTER — Other Ambulatory Visit: Payer: Self-pay | Admitting: *Deleted

## 2015-11-22 DIAGNOSIS — C7800 Secondary malignant neoplasm of unspecified lung: Principal | ICD-10-CM

## 2015-11-22 DIAGNOSIS — C20 Malignant neoplasm of rectum: Secondary | ICD-10-CM

## 2015-11-22 MED ORDER — PANTOPRAZOLE SODIUM 40 MG PO TBEC: 40.0000 mg | DELAYED_RELEASE_TABLET | Freq: Every day | ORAL | Status: DC

## 2015-11-23 ENCOUNTER — Telehealth: Payer: Self-pay | Admitting: Oncology

## 2015-11-23 NOTE — Telephone Encounter (Signed)
Spoke with Dr. Nash Mantis office [place]) [phone] regarding obtaining auth and the referral coordinator will call in reference to obtaining the necessary info from out Center to secure a auto for our office

## 2015-11-24 ENCOUNTER — Telehealth: Payer: Self-pay | Admitting: Oncology

## 2015-11-24 ENCOUNTER — Telehealth: Payer: Self-pay | Admitting: *Deleted

## 2015-11-24 ENCOUNTER — Encounter: Payer: Self-pay | Admitting: Internal Medicine

## 2015-11-24 ENCOUNTER — Ambulatory Visit (INDEPENDENT_AMBULATORY_CARE_PROVIDER_SITE_OTHER): Payer: Managed Care, Other (non HMO) | Admitting: Internal Medicine

## 2015-11-24 VITALS — BP 142/78 | HR 82 | Ht 71.0 in | Wt 111.0 lb

## 2015-11-24 DIAGNOSIS — C61 Malignant neoplasm of prostate: Secondary | ICD-10-CM

## 2015-11-24 DIAGNOSIS — I4901 Ventricular fibrillation: Secondary | ICD-10-CM | POA: Diagnosis not present

## 2015-11-24 DIAGNOSIS — Z95810 Presence of automatic (implantable) cardiac defibrillator: Secondary | ICD-10-CM | POA: Diagnosis not present

## 2015-11-24 DIAGNOSIS — I519 Heart disease, unspecified: Secondary | ICD-10-CM

## 2015-11-24 DIAGNOSIS — C7800 Secondary malignant neoplasm of unspecified lung: Principal | ICD-10-CM

## 2015-11-24 DIAGNOSIS — C20 Malignant neoplasm of rectum: Secondary | ICD-10-CM

## 2015-11-24 LAB — CUP PACEART INCLINIC DEVICE CHECK
Battery Voltage: 3.03 V
Brady Statistic RV Percent Paced: 0.02 %
HighPow Impedance: 72 Ohm
Implantable Lead Implant Date: 20160503
Implantable Lead Location: 753860
Lead Channel Impedance Value: 494 Ohm
Lead Channel Pacing Threshold Amplitude: 0.75 V
Lead Channel Pacing Threshold Pulse Width: 0.4 ms
Lead Channel Sensing Intrinsic Amplitude: 9.625 mV
MDC IDC MSMT BATTERY REMAINING LONGEVITY: 132 mo
MDC IDC MSMT LEADCHNL RV IMPEDANCE VALUE: 380 Ohm
MDC IDC SESS DTM: 20170602152930
MDC IDC SET LEADCHNL RV PACING AMPLITUDE: 1.25 V
MDC IDC SET LEADCHNL RV PACING PULSEWIDTH: 0.4 ms
MDC IDC SET LEADCHNL RV SENSING SENSITIVITY: 0.3 mV

## 2015-11-24 NOTE — Patient Instructions (Signed)
Medication Instructions: - Your physician recommends that you continue on your current medications as directed. Please refer to the Current Medication list given to you today.  Labwork: - none  Procedures/Testing: - none  Follow-Up: - Your physician recommends that you schedule a follow-up appointment in: 3 months with Dr. Caryl Comes.  Any Additional Special Instructions Will Be Listed Below (If Applicable).     If you need a refill on your cardiac medications before your next appointment, please call your pharmacy.

## 2015-11-24 NOTE — Progress Notes (Signed)
ELECTROPHYSIOLOGY CONSULT NOTE  Patient ID: Michael Wade, MRN: [id number], DOB/AGE: 1946/04/25 70 y.o. Admit date: (Not on file) Date of Consult: 11/24/2015  Primary Physician: Pcp Not In System Primary Cardiologist: new ([place]) Consulting Physician N/A  Chief Complaint: to establish    HPI Michael Wade is a 70 y.o. male  Pertinent presents for establishment of ICD follow-up.  He has a history of ischemic and nonischemic cardiomyopathy. He had a VF arrest last fall in the context of prior chemotherapy for cancers involving both his rectum and his prostate. It was elected to treat his occluded right coronary artery medically. Ejection fraction both by cath and echo was 15-20%. LVEDP was elevated. A month later, 12/16, he underwent ICD implantation. He has had no appropriate therapy.  He has moved from [place] to [place] to be with his family as he is undergoing radiation therapy. He has had prior chemotherapy involving 18 courses; PET scan earlier this year had demonstrated some attenuation of lesions.  He has had falls. This is occurring in the context of chemotherapy. Device interrogation is demonstrated no arrhythmia  He has no nocturnal dyspnea orthopnea or peripheral edema. He is able to walk more than 100 feet.   Past Medical History  Diagnosis Date  . 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 Prostate cancer ([place])   . Rectal cancer ([place])   . CAD (coronary artery disease)   . HTN (hypertension)       Surgical History:  Past Surgical History  Procedure Laterality Date  . Left knee surgery    . Cardiac defibrillator placement       Home Meds: Prior to Admission medications   Medication Sig Start Date End Date Taking? Authorizing Provider  capecitabine (XELODA) 500 MG tablet Take 3 tablets (1,500 mg) QAM 2 tabs (1,000 mg) QPM on days of radiation only (Mon-Fri) 11/08/15  Yes Michael Pier, MD  carvedilol (COREG) 6.25 MG tablet Take 6.25 mg by mouth daily.  07/12/15 07/11/16 Yes Historical Provider, MD  DULoxetine (CYMBALTA) 30 MG capsule Take 30 mg by mouth daily.   Yes Historical Provider, MD  ferrous sulfate 325 (65 FE) MG tablet Take 325 mg by mouth daily with breakfast.   Yes Historical Provider, MD  furosemide (LASIX) 40 MG tablet Take 40 mg by mouth daily.   Yes Historical Provider, MD  HYDROcodone-acetaminophen (NORCO/VICODIN) 5-325 MG tablet Take 1 tablet by mouth every 4 (four) hours as needed for moderate pain.   Yes Historical Provider, MD  megestrol (MEGACE) 400 MG/10ML suspension Take 5 mLs (200 mg total) by mouth 2 (two) times daily. 11/09/15  Yes Michael Pier, MD  pantoprazole (PROTONIX) 40 MG tablet Take 1 tablet (40 mg total) by mouth daily. 11/22/15 11/21/16 Yes Michael Pier, MD  Polyethylene Glycol 3350 (MIRALAX PO) Take 17 g by mouth daily.   Yes Michael Pier, MD  pyridOXINE (B-6) 50 MG tablet Take 50 mg by mouth daily.   Yes Historical Provider, MD    Allergies: No Known Allergies  Social History   Social History  . Marital Status: Single    Spouse Name: N/A  . Number of Children: 0  . Years of Education: N/A   Occupational History  . retired     Personal assistant   Social History Main Topics  . Smoking status: Former Smoker    Types: Cigarettes  . Smokeless tobacco: Never Used     Comment: Smoked from [id number], 1/2 PPD  .  Alcohol Use: No  . Drug Use: No  . Sexual Activity: Not on file   Other Topics Concern  . Not on file   Social History Narrative   Single, no children   Retired from Personal assistant   Lives w/sister and his mom   Ambulates-has walker--FALL RISK     Family History  Problem Relation Age of Onset  . Prostate cancer Father      ROS:  Please see the history of present illness.     All other systems reviewed and negative.    Physical Exam:   Blood pressure 142/78, pulse 82, height 5\' 11"  (1.803 m), weight 111 lb (50.349 kg). General: Well developed, cachectic African-American age  appearing male in no acute distress. Head: Normocephalic, atraumatic, sclera non-icteric, no xanthomas, nares are without discharge. EENT: normal  Lymph Nodes:  none Neck: Negative for carotid bruits. JVD not elevated. Back:without scoliosis kyphosis  Lungs: Clear bilaterally to auscultation without wheezes, rales, or rhonchi. Breathing is unlabored. Heart: RRR with S1 S2. No  murmur . No rubs, or gallops appreciated. Abdomen: Soft, non-tender, non-distended with normoactive bowel sounds. No hepatomegaly. No rebound/guarding. No obvious abdominal masses. Msk:  Strength and tone appear normal for age. Extremities: No clubbing or cyanosis. No  edema.  Distal pedal pulses are 2+ and equal bilaterally. Skin: Warm and Dry Neuro: Alert and oriented X 3. CN III-XII intact Grossly normal sensory and motor function . Psych:  Responds to questions appropriately with a normal affect.      Labs: Cardiac Enzymes No results for input(s): CKTOTAL, CKMB, TROPONINI in the last 72 hours. CBC Lab Results  Component Value Date   WBC 10.0 10/24/2015   HGB 10.6* 10/24/2015   HCT 32.1* 10/24/2015   MCV 99.1* 10/24/2015   PLT 323 10/24/2015   PROTIME: No results for input(s): LABPROT, INR in the last 72 hours. Chemistry No results for input(s): NA, K, CL, CO2, BUN, CREATININE, CALCIUM, PROT, BILITOT, ALKPHOS, ALT, AST, GLUCOSE in the last 168 hours.  Invalid input(s): LABALBU Lipids No results found for: CHOL, HDL, LDLCALC, TRIG BNP No results found for: PROBNP Thyroid Function Tests: No results for input(s): TSH, T4TOTAL, T3FREE, THYROIDAB in the last 72 hours.  Invalid input(s): FREET3 Miscellaneous No results found for: DDIMER  Radiology/Studies:  Ct Outside Films Body  17-Nov-2015  CLINICAL DATA:  This exam is stored here for comparison purposes only and was performed at an outside facility.   Please contact the originating institution for any associated interpretation or report.     EKG: Sinus rhythm at 82 Intervals 15/07/40 Prior inferior wall MI   Assessment and Plan:  Ischemic cardiomyopathy-occluded right coronary artery  Nonischemic cardiomyopathy  VF arrest  Implantable defibrillator-[employer]tronic-single chamber  Prostate cancer/lung cancer antecedent chemotherapy and anticipated radiation therapy  Falls    The patient's device is functioning normally. It is far enough away from the radiation    field that frequent surveillance and removing the device will be unnecessary.  We will plan to see him again in about 3 months. I suspect this falls related to the comorbidities associated with his chemotherapy and is 45 pound weight loss; this representing about 30% of his body weight      Virl Axe

## 2015-11-24 NOTE — Telephone Encounter (Signed)
per pof to sch pt referral @ [place] office they will call pt with appt

## 2015-11-24 NOTE — Telephone Encounter (Signed)
Oncology Nurse Navigator Documentation  Oncology Nurse Navigator Flowsheets 11/24/2015  Navigator Location [facility]  Navigator Encounter Type Telephone  Telephone Incoming Call  Patient Visit Type -  Treatment Phase -  Barriers/Navigation Needs Coordination of Care--needs PCP for his insurance to continue to pay for his care  Education -  Interventions Coordination of Care--referral to [facility] primary care asap  Referrals -  Coordination of Care Appts  Education Method -  Acuity -  Time Spent with Patient 15

## 2015-11-24 NOTE — Telephone Encounter (Signed)
Spoke with referring office in [place] regarding obtaining auth. Michael Wade will put the info in and it will be pending until pt secures PCP in [employer]. Spoke with pt and his family regarding securing a pcp locally and encourage them to reach to insurance company to make any adjustment in regards to his relocation to [place].

## 2015-11-28 ENCOUNTER — Ambulatory Visit
Admission: RE | Admit: 2015-11-28 | Discharge: 2015-11-28 | Disposition: A | Discharge status: Home / Self Care | Payer: Medicare (Managed Care) | Source: Ambulatory Visit | Attending: Radiation Oncology | Admitting: Radiation Oncology

## 2015-11-28 ENCOUNTER — Ambulatory Visit: Payer: Medicare (Managed Care) | Admitting: Radiation Oncology

## 2015-11-28 ENCOUNTER — Encounter: Payer: Self-pay | Admitting: *Deleted

## 2015-11-28 NOTE — Progress Notes (Signed)
[facility] Psychosocial Distress Screening Clinical Social Work  Clinical Social Work was referred by distress screening protocol.  The patient scored a 6 on the Psychosocial Distress Thermometer which indicates moderate distress. Clinical Social Worker contacted patient at home to assess for distress and other psychosocial needs.  CSW left patient a detailed message offering support and information on our support team and support services at [facility].  CSW encouraged patient to call with any additional questions or concerns.       ONCBCN DISTRESS SCREENING 11/15/2015  Screening Type Initial Screening  Distress experienced in past week (1-10) 6  Emotional problem type Depression;Nervousness/Anxiety  Physical Problem type Sleep/insomnia;Constipation/diarrhea;Tingling hands/feet;Skin dry/itchy  Physician notified of physical symptoms Yes  Referral to clinical social work Yes   Johnnye Lana, MSW, LCSW, OSW-C Clinical Social Worker [facility] [phone]

## 2015-11-29 ENCOUNTER — Ambulatory Visit: Payer: Medicare (Managed Care)

## 2015-11-29 ENCOUNTER — Ambulatory Visit
Admission: RE | Admit: 2015-11-29 | Discharge: 2015-11-29 | Disposition: A | Discharge status: Home / Self Care | Payer: Medicare (Managed Care) | Source: Ambulatory Visit | Attending: Radiation Oncology | Admitting: Radiation Oncology

## 2015-11-29 DIAGNOSIS — Z510 Encounter for antineoplastic radiation therapy: Secondary | ICD-10-CM | POA: Diagnosis not present

## 2015-11-30 ENCOUNTER — Ambulatory Visit
Admission: RE | Admit: 2015-11-30 | Discharge: 2015-11-30 | Disposition: A | Discharge status: Home / Self Care | Payer: Managed Care, Other (non HMO) | Source: Ambulatory Visit | Attending: Radiation Oncology | Admitting: Radiation Oncology

## 2015-11-30 ENCOUNTER — Telehealth: Payer: Self-pay | Admitting: Pharmacist

## 2015-11-30 ENCOUNTER — Ambulatory Visit
Admission: RE | Admit: 2015-11-30 | Discharge: 2015-11-30 | Disposition: A | Discharge status: Home / Self Care | Payer: Medicare (Managed Care) | Source: Ambulatory Visit | Attending: Radiation Oncology | Admitting: Radiation Oncology

## 2015-11-30 DIAGNOSIS — Z510 Encounter for antineoplastic radiation therapy: Secondary | ICD-10-CM | POA: Diagnosis not present

## 2015-11-30 NOTE — Telephone Encounter (Signed)
Recv'd voicemail in [facility] from [place] (called at [phone] this AM).  She was asking if pt could get XRT today. I called back and left vm for them to contact Rad Onc or call main number to [facility] and ask for Merceda Elks, RN navigator if the question relates just to getting XRT. If I need to assist w/ Xeloda, I can. Kennith Center, Pharm.D., CPP 11/30/2015@12 :09 PM Oral Chemo Clinic

## 2015-11-30 NOTE — Progress Notes (Addendum)
Patient education  Done radiation therapy and you book given, discussed side effects, N,V,D, skin irritation, fatigue, urinary urgency,rectal discomfort,dysuria,  Imodium for diarrhea, low fiber diet with diarrhea, may need to eat 5-6 smaller meals with snacks, high calorie drinks, increase protein in diet,loss appetite, may need for IVF"S  Use baby wipes no alcohol, sitz bath prn, verbal understanding,teach back given

## 2015-11-30 NOTE — Telephone Encounter (Signed)
Pts dtr Camila Li called right back to [facility] and clarified that she gave her father his 1st Xeloda today in anticipation of XRT beginning. There were questions yesterday w/ insurance (?) and they didn't get to start as planned. Lorriane Shire states her father does not feel well today.  His stomach is hurting. They plan to arrive 30 min early for their appt at 2 pm today in XRT. Kennith Center, Pharm.D., CPP 11/30/2015@12 :14 PM Oral Chemo Clinic

## 2015-12-01 ENCOUNTER — Encounter: Payer: Self-pay | Admitting: Radiation Oncology

## 2015-12-01 ENCOUNTER — Ambulatory Visit
Admission: RE | Admit: 2015-12-01 | Discharge: 2015-12-01 | Disposition: A | Discharge status: Home / Self Care | Payer: Managed Care, Other (non HMO) | Source: Ambulatory Visit | Attending: Radiation Oncology | Admitting: Radiation Oncology

## 2015-12-01 ENCOUNTER — Ambulatory Visit
Admission: RE | Admit: 2015-12-01 | Discharge: 2015-12-01 | Disposition: A | Discharge status: Home / Self Care | Payer: Medicare (Managed Care) | Source: Ambulatory Visit | Attending: Radiation Oncology | Admitting: Radiation Oncology

## 2015-12-01 VITALS — BP 118/72 | HR 91 | Temp 98.0°F | Resp 20 | Wt 107.1 lb

## 2015-12-01 DIAGNOSIS — Z510 Encounter for antineoplastic radiation therapy: Secondary | ICD-10-CM | POA: Diagnosis not present

## 2015-12-01 DIAGNOSIS — C20 Malignant neoplasm of rectum: Secondary | ICD-10-CM

## 2015-12-01 DIAGNOSIS — C61 Malignant neoplasm of prostate: Secondary | ICD-10-CM

## 2015-12-01 DIAGNOSIS — C7800 Secondary malignant neoplasm of unspecified lung: Secondary | ICD-10-CM

## 2015-12-01 NOTE — Progress Notes (Signed)
Weekly rad txs rectal/prosatte 2/28 completed, c/o bloating only, started xeloda bid, no nausea, burping and abdominal gas, bowels  Just blood stated brownish red color stated  and bladder okay per patient, appetite fair, fatigued  2:30 PM BP 118/72 mmHg  Pulse 91  Temp(Src) 98 F (36.7 C) (Oral)  Resp 20  Wt 107 lb 1.6 oz (48.58 kg)  SpO2 100%  Wt Readings from Last 3 Encounters:  12/01/15 107 lb 1.6 oz (48.58 kg)  11/24/15 111 lb (50.349 kg)  11/15/15 101 lb 11.2 oz (46.131 kg)

## 2015-12-03 NOTE — Progress Notes (Signed)
   Department of Radiation Oncology  Phone:  [phone] Fax:        [phone]  Weekly Treatment Note    Name: Michael Wade Date: 12/03/2015 MRN: [id number] DOB: 18-Jul-1945   Diagnosis:     ICD-9-CM ICD-10-CM   1. Prostate cancer ([place]) 185 C61   2. 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 capecitabine (XELODA) 500 MG tablet Take 3 tablets (1,500 mg) QAM 2 tabs (1,000 mg) QPM on days of radiation only (Mon-Fri) 140 tablet 0  . carvedilol (COREG) 6.25 MG tablet Take 6.25 mg by mouth daily.    . DULoxetine (CYMBALTA) 30 MG capsule Take 30 mg by mouth daily.    . ferrous sulfate 325 (65 FE) MG tablet Take 325 mg by mouth daily with breakfast.    . furosemide (LASIX) 40 MG tablet Take 40 mg by mouth daily.    Marland Kitchen HYDROcodone-acetaminophen (NORCO/VICODIN) 5-325 MG tablet Take 1 tablet by mouth every 4 (four) hours as needed for moderate pain.    . megestrol (MEGACE) 400 MG/10ML suspension Take 5 mLs (200 mg total) by mouth 2 (two) times daily. 300 mL 0  . pantoprazole (PROTONIX) 40 MG tablet Take 1 tablet (40 mg total) by mouth daily. 30 tablet 1  . Polyethylene Glycol 3350 (MIRALAX PO) Take 17 g by mouth daily.    Marland Kitchen pyridOXINE (B-6) 50 MG tablet Take 50 mg by mouth daily.     No current facility-administered medications for this encounter.     ALLERGIES: Review of patient's allergies indicates no known allergies.   LABORATORY DATA:  Lab Results  Component Value Date   WBC 10.0 10/24/2015   HGB 10.6* 10/24/2015   HCT 32.1* 10/24/2015   MCV 99.1* 10/24/2015   PLT 323 10/24/2015   Lab Results  Component Value Date   NA 139 10/24/2015   K 4.3 10/24/2015   CO2 24 10/24/2015   Lab Results  Component Value Date   ALT 14 10/24/2015   AST 23 10/24/2015   ALKPHOS 156* 10/24/2015   BILITOT <0.30 10/24/2015      NARRATIVE: Michael Wade was seen today for weekly treatment management. The chart was checked and the patient's films were reviewed.  Weekly rad txs rectal/prosatte 2/28 completed, c/o bloating only, started xeloda bid, no nausea, burping and abdominal gas, bowels  Just blood stated brownish red color stated  and bladder okay per patient, appetite fair, fatigued  2:01 PM BP 118/72 mmHg  Pulse 91  Temp(Src) 98 F (36.7 C) (Oral)  Resp 20  Wt 107 lb 1.6 oz (48.58 kg)  SpO2 100%  Wt Readings from Last 3 Encounters:  12/01/15 107 lb 1.6 oz (48.58 kg)  11/24/15 111 lb (50.349 kg)  11/15/15 101 lb 11.2 oz (46.131 kg)    PHYSICAL EXAMINATION: weight is 107 lb 1.6 oz (48.58 kg). His oral temperature is 98 F (36.7 C). His blood pressure is 118/72 and his pulse is 91. His respiration is 20 and oxygen saturation is 100%.        ASSESSMENT: The patient is doing satisfactorily with treatment.  PLAN: We will continue with the patient's radiation treatment as planned.

## 2015-12-04 ENCOUNTER — Encounter: Payer: Self-pay | Admitting: Internal Medicine

## 2015-12-04 ENCOUNTER — Ambulatory Visit
Admission: RE | Admit: 2015-12-04 | Discharge: 2015-12-04 | Disposition: A | Discharge status: Home / Self Care | Payer: Medicare (Managed Care) | Source: Ambulatory Visit | Attending: Radiation Oncology | Admitting: Radiation Oncology

## 2015-12-04 DIAGNOSIS — Z510 Encounter for antineoplastic radiation therapy: Secondary | ICD-10-CM | POA: Diagnosis not present

## 2015-12-05 ENCOUNTER — Ambulatory Visit (HOSPITAL_BASED_OUTPATIENT_CLINIC_OR_DEPARTMENT_OTHER): Payer: 59

## 2015-12-05 ENCOUNTER — Ambulatory Visit (HOSPITAL_BASED_OUTPATIENT_CLINIC_OR_DEPARTMENT_OTHER): Payer: 59 | Admitting: Nurse Practitioner

## 2015-12-05 ENCOUNTER — Telehealth: Payer: Self-pay | Admitting: Nurse Practitioner

## 2015-12-05 ENCOUNTER — Other Ambulatory Visit: Payer: Self-pay | Admitting: *Deleted

## 2015-12-05 ENCOUNTER — Other Ambulatory Visit (HOSPITAL_BASED_OUTPATIENT_CLINIC_OR_DEPARTMENT_OTHER): Payer: 59

## 2015-12-05 ENCOUNTER — Encounter: Payer: Self-pay | Admitting: *Deleted

## 2015-12-05 ENCOUNTER — Ambulatory Visit
Admission: RE | Admit: 2015-12-05 | Discharge: 2015-12-05 | Disposition: A | Discharge status: Home / Self Care | Payer: Medicare (Managed Care) | Source: Ambulatory Visit | Attending: Radiation Oncology | Admitting: Radiation Oncology

## 2015-12-05 VITALS — BP 121/70 | HR 86 | Temp 98.5°F | Resp 17 | Ht 71.0 in | Wt 111.9 lb

## 2015-12-05 DIAGNOSIS — D509 Iron deficiency anemia, unspecified: Secondary | ICD-10-CM

## 2015-12-05 DIAGNOSIS — C7800 Secondary malignant neoplasm of unspecified lung: Secondary | ICD-10-CM

## 2015-12-05 DIAGNOSIS — C61 Malignant neoplasm of prostate: Secondary | ICD-10-CM | POA: Diagnosis not present

## 2015-12-05 DIAGNOSIS — C20 Malignant neoplasm of rectum: Secondary | ICD-10-CM

## 2015-12-05 DIAGNOSIS — Z95828 Presence of other vascular implants and grafts: Secondary | ICD-10-CM | POA: Insufficient documentation

## 2015-12-05 DIAGNOSIS — Z510 Encounter for antineoplastic radiation therapy: Secondary | ICD-10-CM | POA: Diagnosis not present

## 2015-12-05 LAB — CBC WITH DIFFERENTIAL/PLATELET
BASO%: 0.9 % (ref 0.0–2.0)
Basophils Absolute: 0.1 10*3/uL (ref 0.0–0.1)
EOS ABS: 0.1 10*3/uL (ref 0.0–0.5)
EOS%: 1.5 % (ref 0.0–7.0)
HCT: 27.9 % — ABNORMAL LOW (ref 38.4–49.9)
HGB: 9.4 g/dL — ABNORMAL LOW (ref 13.0–17.1)
LYMPH%: 14.6 % (ref 14.0–49.0)
MCH: 33.9 pg — ABNORMAL HIGH (ref 27.2–33.4)
MCHC: 33.7 g/dL (ref 32.0–36.0)
MCV: 100.8 fL — AB (ref 79.3–98.0)
MONO#: 0.4 10*3/uL (ref 0.1–0.9)
MONO%: 4.5 % (ref 0.0–14.0)
NEUT%: 78.5 % — AB (ref 39.0–75.0)
NEUTROS ABS: 6.4 10*3/uL (ref 1.5–6.5)
Platelets: 297 10*3/uL (ref 140–400)
RBC: 2.77 10*6/uL — AB (ref 4.20–5.82)
RDW: 15.9 % — ABNORMAL HIGH (ref 11.0–14.6)
WBC: 8.1 10*3/uL (ref 4.0–10.3)
lymph#: 1.2 10*3/uL (ref 0.9–3.3)

## 2015-12-05 MED ORDER — SODIUM CHLORIDE 0.9 % IJ SOLN
10.0000 mL | INTRAMUSCULAR | Status: DC | PRN
  Administered 2015-12-05: 10 mL via INTRAVENOUS
  Filled 2015-12-05: qty 10

## 2015-12-05 MED ORDER — HEPARIN SOD (PORK) LOCK FLUSH 100 UNIT/ML IV SOLN
500.0000 [IU] | Freq: Once | INTRAVENOUS | Status: AC | PRN
  Administered 2015-12-05: 500 [IU] via INTRAVENOUS
  Filled 2015-12-05: qty 5

## 2015-12-05 NOTE — Telephone Encounter (Signed)
pof was amended.. left msg confirming new apt dates & times

## 2015-12-05 NOTE — Telephone Encounter (Signed)
per pof to sch pt appt-cld and left a message of time & appt 6/27& 7/11 @11 :45-adv pt they could stop by scheduling and get updated avs

## 2015-12-05 NOTE — Addendum Note (Signed)
Addended by: Randolm Idol on: 12/05/2015 11:39 AM   Modules accepted: Medications

## 2015-12-05 NOTE — Progress Notes (Signed)
  [place] OFFICE PROGRESS NOTE   Diagnosis: Rectal cancer, prostate cancer   INTERVAL HISTORY:   Mr. Newbanks returns as scheduled. He began radiation/Xeloda 11/30/2015. He denies nausea/vomiting. No mouth sores. No diarrhea. No hand or foot pain or redness. He continues to note blood with bowel movements. No rectal pain.   Objective:  Vital signs in last 24 hours:  Blood pressure 121/70, pulse 86, temperature 98.5 F (36.9 C), temperature source Oral, resp. rate 17, height 5\' 11"  (1.803 m), weight 111 lb 14.4 oz (50.758 kg), SpO2 100 %.    HEENT: No thrush or ulcers. Resp: Lungs clear bilaterally. Cardio: Regular rate and rhythm. GI: No hepatomegaly. Vascular: No leg edema. Skin: Palms without erythema. Port-A-Cath without erythema.    Lab Results:  Lab Results  Component Value Date   WBC 8.1 12/05/2015   HGB 9.4* 12/05/2015   HCT 27.9* 12/05/2015   MCV 100.8* 12/05/2015   PLT 297 12/05/2015   NEUTROABS 6.4 12/05/2015    Imaging:  No results found.  Medications: I have reviewed the patient's current medications.  Assessment/Plan: 1. rectal cancer, stage IV, a lung metastasis and Metastatic lymphadenopathy on initial staging), status post A rectal biopsy 08/07/2015 , CEA 2.8 on 09/22/2014  colonoscopy 08/06/2014 confirmed a mass extending from 7-18 cm from the anus  MRI 08/19/2014 revealed a circumferential mass at the rectum, no definite extension beyond the serosal margin, no perirectal adenopathy  PET scan 09/30/2014 with an FDG avid upper rectal mass, extension into adjacent mesorectal fat versus mesorectal lymphadenopathy, FDG avid left retroperitoneal lymph nodes and an FDG avid Right upper lung groundglass nodule measuring 1.9 x 1.1 cm.  CT-guided biopsy of the right lung nodule 10/12/2014 confirmed metastatic colorectal cancer  treatment with FOLFOX/Avastin for 12 cycles completed December 2016, oxaliplatin deleted after cycle 10  secondary to neuropathy  restaging PET scan 02/23/2015 after 6 cycles revealed local regional response and the right upper lobe nodule had almost completely resolved  PET scan 06/12/2015 after cycle 12 revealed continued improvement in the rectal mass, hypermetabolic Retroperitoneal lymphadenopathy no longer seen, hyper metabolic activity at right lung nodule had resolved  one cycle of Xeloda January 2017  CTs chest, abdomen, and pelvis 11/03/2015-clustered peripheral right upper lobe nodules, no other evidence of metastatic disease, rectal mass with large colonic stool burden, 1.6 and meter presacral rim-enhancing fluid collection  Initiation of radiation/Xeloda 11/30/2015   2. Prostate cancer, PSA 11.3 09/05/2014   Prostate biopsy 09/05/2014 confirmed a Gleason 7 tumor  maintained on every three-month Lupron starting 11/23/2014   3. Iron deficiency anemia  4. 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 History of CAD, EF 15%  6. Hypertension 7. 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 Family history of colon and pancreas cancer   Disposition: Mr. Lenzini appears stable. He began concurrent Xeloda/radiation 11/30/2015. Plan to continue the same. He will return for labs and a follow-up visit in 2 weeks. He will contact the office in the interim with any problems.  Plan reviewed with Dr. Benay Spice.    Ned Card ANP/GNP-BC   12/05/2015  11:02 AM

## 2015-12-05 NOTE — Progress Notes (Signed)
Right port documented on flow sheet from [facility] information from [facility]

## 2015-12-05 NOTE — Patient Instructions (Signed)

## 2015-12-06 ENCOUNTER — Ambulatory Visit
Admission: RE | Admit: 2015-12-06 | Discharge: 2015-12-06 | Disposition: A | Discharge status: Home / Self Care | Payer: Medicare (Managed Care) | Source: Ambulatory Visit | Attending: Radiation Oncology | Admitting: Radiation Oncology

## 2015-12-06 ENCOUNTER — Telehealth: Payer: Self-pay | Admitting: Oncology

## 2015-12-06 DIAGNOSIS — Z510 Encounter for antineoplastic radiation therapy: Secondary | ICD-10-CM | POA: Diagnosis not present

## 2015-12-06 NOTE — Telephone Encounter (Signed)
Printed new sched. Pt had another appt on 6.27

## 2015-12-07 ENCOUNTER — Ambulatory Visit
Admission: RE | Admit: 2015-12-07 | Discharge: 2015-12-07 | Disposition: A | Discharge status: Home / Self Care | Payer: Medicare (Managed Care) | Source: Ambulatory Visit | Attending: Radiation Oncology | Admitting: Radiation Oncology

## 2015-12-07 DIAGNOSIS — Z510 Encounter for antineoplastic radiation therapy: Secondary | ICD-10-CM | POA: Diagnosis not present

## 2015-12-08 ENCOUNTER — Encounter: Payer: Self-pay | Admitting: Radiation Oncology

## 2015-12-08 ENCOUNTER — Ambulatory Visit
Admission: RE | Admit: 2015-12-08 | Discharge: 2015-12-08 | Disposition: A | Discharge status: Home / Self Care | Payer: Medicare (Managed Care) | Source: Ambulatory Visit | Attending: Radiation Oncology | Admitting: Radiation Oncology

## 2015-12-08 ENCOUNTER — Ambulatory Visit
Admission: RE | Admit: 2015-12-08 | Discharge: 2015-12-08 | Disposition: A | Discharge status: Home / Self Care | Payer: 59 | Source: Ambulatory Visit | Attending: Radiation Oncology | Admitting: Radiation Oncology

## 2015-12-08 VITALS — BP 116/55 | HR 86 | Temp 98.9°F | Resp 16 | Wt 108.6 lb

## 2015-12-08 DIAGNOSIS — Z510 Encounter for antineoplastic radiation therapy: Secondary | ICD-10-CM | POA: Diagnosis not present

## 2015-12-08 DIAGNOSIS — C7800 Secondary malignant neoplasm of unspecified lung: Secondary | ICD-10-CM

## 2015-12-08 DIAGNOSIS — C61 Malignant neoplasm of prostate: Secondary | ICD-10-CM

## 2015-12-08 DIAGNOSIS — C20 Malignant neoplasm of rectum: Secondary | ICD-10-CM

## 2015-12-08 MED ORDER — MEGESTROL ACETATE 400 MG/10ML PO SUSP: 200.0000 mg | Freq: Two times a day (BID) | ORAL | Status: AC

## 2015-12-08 MED ORDER — OXYCODONE-ACETAMINOPHEN 10-325 MG PO TABS: 1.0000 | ORAL_TABLET | ORAL | Status: DC | PRN

## 2015-12-08 NOTE — Progress Notes (Addendum)
BP 116/55 mmHg  Pulse 86  Temp(Src) 98.9 F (37.2 C) (Oral)  Resp 16  Wt 108 lb 9.6 oz (49.261 kg)  Wt Readings from Last 3 Encounters:  12/08/15 108 lb 9.6 oz (49.261 kg)  12/05/15 111 lb 14.4 oz (50.758 kg)  12/01/15 107 lb 1.6 oz (48.58 kg)   weekly rad txs rectal/prosatte,  Loss 3 lbs, drinks 1 ensure daily , encourage to increase  To 2-3 ensures a day,   Had 4 bowle movments and voiding urine 4 x last night, abdominal gas and pain, says hydrocodone not helping, weakness no nausea , no irritatin to his bottom as yet stated 1:26 PM '

## 2015-12-08 NOTE — Progress Notes (Signed)
Department of Radiation Oncology  Phone:  [phone] Fax:        [phone]  Weekly Treatment Note    Name: Michael Wade Date: 12/09/2015 MRN: [id number] DOB: 1946/06/24   Diagnosis:     ICD-9-CM ICD-10-CM   1. Prostate cancer ([place]) 185 C61   2. 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 capecitabine (XELODA) 500 MG tablet Take 3 tablets (1,500 mg) QAM 2 tabs (1,000 mg) QPM on days of radiation only (Mon-Fri) 140 tablet 0  . carvedilol (COREG) 6.25 MG tablet Take 6.25 mg by mouth daily.    . DULoxetine (CYMBALTA) 30 MG capsule Take 30 mg by mouth daily.    . ferrous sulfate 325 (65 FE) MG tablet Take 325 mg by mouth daily with breakfast.    . furosemide (LASIX) 40 MG tablet Take 40 mg by mouth daily.    . megestrol (MEGACE) 400 MG/10ML suspension Take 5 mLs (200 mg total) by mouth 2 (two) times daily. 300 mL 0  . pantoprazole (PROTONIX) 40 MG tablet Take 1 tablet (40 mg total) by mouth daily. 30 tablet 1  . Polyethylene Glycol 3350 (MIRALAX PO) Take 17 g by mouth daily.    Marland Kitchen pyridOXINE (B-6) 50 MG tablet Take 50 mg by mouth daily.    Marland Kitchen oxyCODONE-acetaminophen (PERCOCET) 10-325 MG tablet Take 1 tablet by mouth every 4 (four) hours as needed for pain. 60 tablet 0   No current facility-administered medications for this encounter.     ALLERGIES: Review of patient's allergies indicates no known allergies.   LABORATORY DATA:  Lab Results  Component Value Date   WBC 8.1 12/05/2015   HGB 9.4* 12/05/2015   HCT 27.9* 12/05/2015   MCV 100.8* 12/05/2015   PLT 297 12/05/2015   Lab Results  Component Value Date   NA 139 10/24/2015   K 4.3 10/24/2015   CO2 24 10/24/2015   Lab Results  Component Value Date   ALT 14 10/24/2015   AST 23 10/24/2015   ALKPHOS 156* 10/24/2015   BILITOT <0.30 10/24/2015      NARRATIVE: Michael Wade was seen today for weekly treatment management. The chart was checked and the patient's films were reviewed.  The patient has lost 3 lbs in the past 3 days. He drinks 1 ensure daily and he was encouraged to increase it to 2-3 ensures a day. He had 4 bowel movments and voided urine x 4 last night. He has abdominal gas and pain and says hydrocodone is not helping. Denies nausea. Denies rectal irritation. Reports weakness.  9:35 PM BP 116/55 mmHg  Pulse 86  Temp(Src) 98.9 F (37.2 C) (Oral)  Resp 16  Wt 108 lb 9.6 oz (49.261 kg)  Wt Readings from Last 3 Encounters:  12/08/15 108 lb 9.6 oz (49.261 kg)  12/05/15 111 lb 14.4 oz (50.758 kg)  12/01/15 107 lb 1.6 oz (48.58 kg)    PHYSICAL EXAMINATION: weight is 108 lb 9.6 oz (49.261 kg). His oral temperature is 98.9 F (37.2 C). His blood pressure is 116/55 and his pulse is 86. His respiration is 16.   Presents to the clinic in a wheelchair. Nomoactive bowel sounds.  ASSESSMENT: The patient is doing satisfactorily with treatment.  PLAN: We will continue with the patient's radiation treatment as planned. He was advised to  use Imodium for the diarrhea. He will be prescribed oxycodone 10 mg for his abdominal pain.  ------------------------------------------------  Jodelle Gross, MD, PhD  This document serves as a record of services personally performed by Michael Simpson, PA-C and Michael Rudd, MD. It was created on their behalf by Michael Wade, a trained medical scribe. The creation of this record is based on the scribe's personal observations and the providers' statements to them. This document has been checked and approved by the attending provider.

## 2015-12-11 ENCOUNTER — Ambulatory Visit
Admission: RE | Admit: 2015-12-11 | Discharge: 2015-12-11 | Disposition: A | Discharge status: Home / Self Care | Payer: Medicare (Managed Care) | Source: Ambulatory Visit | Attending: Radiation Oncology | Admitting: Radiation Oncology

## 2015-12-11 DIAGNOSIS — Z510 Encounter for antineoplastic radiation therapy: Secondary | ICD-10-CM | POA: Diagnosis not present

## 2015-12-12 ENCOUNTER — Ambulatory Visit
Admission: RE | Admit: 2015-12-12 | Discharge: 2015-12-12 | Disposition: A | Discharge status: Home / Self Care | Payer: Medicare (Managed Care) | Source: Ambulatory Visit | Attending: Radiation Oncology | Admitting: Radiation Oncology

## 2015-12-12 DIAGNOSIS — Z510 Encounter for antineoplastic radiation therapy: Secondary | ICD-10-CM | POA: Diagnosis not present

## 2015-12-13 ENCOUNTER — Ambulatory Visit
Admission: RE | Admit: 2015-12-13 | Discharge: 2015-12-13 | Disposition: A | Discharge status: Home / Self Care | Payer: Medicare (Managed Care) | Source: Ambulatory Visit | Attending: Radiation Oncology | Admitting: Radiation Oncology

## 2015-12-13 DIAGNOSIS — Z510 Encounter for antineoplastic radiation therapy: Secondary | ICD-10-CM | POA: Diagnosis not present

## 2015-12-14 ENCOUNTER — Encounter: Payer: Self-pay | Admitting: *Deleted

## 2015-12-14 ENCOUNTER — Ambulatory Visit
Admission: RE | Admit: 2015-12-14 | Discharge: 2015-12-14 | Disposition: A | Discharge status: Home / Self Care | Payer: Medicare (Managed Care) | Source: Ambulatory Visit | Attending: Radiation Oncology | Admitting: Radiation Oncology

## 2015-12-14 DIAGNOSIS — Z510 Encounter for antineoplastic radiation therapy: Secondary | ICD-10-CM | POA: Diagnosis not present

## 2015-12-14 NOTE — Progress Notes (Signed)
Notice of denial of medical coverage form placed in bin for Michael Wade.

## 2015-12-15 ENCOUNTER — Encounter: Payer: Self-pay | Admitting: Radiation Oncology

## 2015-12-15 ENCOUNTER — Ambulatory Visit
Admission: RE | Admit: 2015-12-15 | Discharge: 2015-12-15 | Disposition: A | Discharge status: Home / Self Care | Payer: 59 | Source: Ambulatory Visit | Attending: Radiation Oncology | Admitting: Radiation Oncology

## 2015-12-15 ENCOUNTER — Ambulatory Visit
Admission: RE | Admit: 2015-12-15 | Discharge: 2015-12-15 | Disposition: A | Discharge status: Home / Self Care | Payer: Medicare (Managed Care) | Source: Ambulatory Visit | Attending: Radiation Oncology | Admitting: Radiation Oncology

## 2015-12-15 VITALS — BP 111/65 | HR 91 | Temp 98.6°F | Resp 20 | Wt 108.8 lb

## 2015-12-15 DIAGNOSIS — C20 Malignant neoplasm of rectum: Secondary | ICD-10-CM

## 2015-12-15 DIAGNOSIS — C7800 Secondary malignant neoplasm of unspecified lung: Principal | ICD-10-CM

## 2015-12-15 DIAGNOSIS — Z510 Encounter for antineoplastic radiation therapy: Secondary | ICD-10-CM | POA: Diagnosis not present

## 2015-12-15 MED ORDER — FUROSEMIDE 40 MG PO TABS: 20.0000 mg | ORAL_TABLET | Freq: Every day | ORAL | Status: DC

## 2015-12-15 NOTE — Progress Notes (Signed)
  Radiation Oncology         [phone]   Name: Michael Wade MRN: [id number]   Date: 12/15/2015  DOB: [birth year]   Weekly Radiation Therapy Management    ICD-9-CM ICD-10-CM   1. 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  Weekly rad txs rectal/prostate 12/28 completed. The patient has diarrhea, started taking OTC [employer] brand for diarrhea. He has a fair-good appetite, poor energy level, fatigued, not using baby wipes or the sitz bath yet, denies bleeding, using unscented dove soap, states no irrittion to groin or bottom areas, and does drink ensure occasionally.  Set-up films were reviewed. The chart was checked.  Physical Findings  weight is 108 lb 12.8 oz (49.351 kg). His oral temperature is 98.6 F (37 C). His blood pressure is 111/65 and his pulse is 91. His respiration is 20.  The patient is having difficulty keeping his weight up.  Impression The patient is tolerating radiation.  Plan Continue treatment as planned. The patient will be referred to Ernestene Kiel, our nutritionist, to gain/manage his weight.     Sheral Apley Tammi Klippel, M.D.  This document serves as a record of services personally performed by Tyler Pita, MD. It was created on his behalf by Darcus Austin, a trained medical scribe. The creation of this record is based on the scribe's personal observations and the provider's statements to them. This document has been checked and approved by the attending provider.

## 2015-12-15 NOTE — Progress Notes (Addendum)
Weekly rad txs rectal/prostate 12 28 completed, having diarrhea, started taking OTC [employer] brand for diarrhea, appetite fair to good, energy  Level poor,fatigued, not using baby wipes or the sitz bath yet, no bleeding, using dove soap unscented, states no irrittion to groin or bottom areas, does drink ensure occasionally 1:35 PM BP 111/65 mmHg  Pulse 91  Temp(Src) 98.6 F (37 C) (Oral)  Resp 20  Wt 108 lb 12.8 oz (49.351 kg)  Wt Readings from Last 3 Encounters:  12/15/15 108 lb 12.8 oz (49.351 kg)  12/08/15 108 lb 9.6 oz (49.261 kg)  12/05/15 111 lb 14.4 oz (50.758 kg)

## 2015-12-18 ENCOUNTER — Ambulatory Visit
Admission: RE | Admit: 2015-12-18 | Discharge: 2015-12-18 | Disposition: A | Discharge status: Home / Self Care | Payer: Medicare (Managed Care) | Source: Ambulatory Visit | Attending: Radiation Oncology | Admitting: Radiation Oncology

## 2015-12-18 ENCOUNTER — Other Ambulatory Visit (HOSPITAL_BASED_OUTPATIENT_CLINIC_OR_DEPARTMENT_OTHER): Payer: 59

## 2015-12-18 ENCOUNTER — Telehealth: Payer: Self-pay | Admitting: Oncology

## 2015-12-18 ENCOUNTER — Ambulatory Visit: Payer: 59

## 2015-12-18 ENCOUNTER — Telehealth: Payer: Self-pay | Admitting: *Deleted

## 2015-12-18 ENCOUNTER — Ambulatory Visit (HOSPITAL_BASED_OUTPATIENT_CLINIC_OR_DEPARTMENT_OTHER): Payer: 59 | Admitting: Nurse Practitioner

## 2015-12-18 VITALS — BP 103/64 | HR 100 | Temp 98.6°F | Resp 18 | Ht 71.0 in | Wt 107.5 lb

## 2015-12-18 DIAGNOSIS — D509 Iron deficiency anemia, unspecified: Secondary | ICD-10-CM | POA: Diagnosis not present

## 2015-12-18 DIAGNOSIS — C7800 Secondary malignant neoplasm of unspecified lung: Secondary | ICD-10-CM | POA: Diagnosis not present

## 2015-12-18 DIAGNOSIS — C61 Malignant neoplasm of prostate: Secondary | ICD-10-CM

## 2015-12-18 DIAGNOSIS — C20 Malignant neoplasm of rectum: Secondary | ICD-10-CM | POA: Diagnosis not present

## 2015-12-18 DIAGNOSIS — Z95828 Presence of other vascular implants and grafts: Secondary | ICD-10-CM

## 2015-12-18 DIAGNOSIS — Z510 Encounter for antineoplastic radiation therapy: Secondary | ICD-10-CM | POA: Diagnosis not present

## 2015-12-18 LAB — COMPREHENSIVE METABOLIC PANEL
ALT: 10 U/L (ref 0–55)
AST: 11 U/L (ref 5–34)
Albumin: 2.6 g/dL — ABNORMAL LOW (ref 3.5–5.0)
Alkaline Phosphatase: 117 U/L (ref 40–150)
Anion Gap: 10 mEq/L (ref 3–11)
BUN: 13.5 mg/dL (ref 7.0–26.0)
CALCIUM: 9.4 mg/dL (ref 8.4–10.4)
CHLORIDE: 100 meq/L (ref 98–109)
CO2: 20 meq/L — AB (ref 22–29)
CREATININE: 0.9 mg/dL (ref 0.7–1.3)
EGFR: >90 mL/min/{1.73_m2} (ref >90)
Glucose: 112 mg/dl (ref 70–140)
POTASSIUM: 3.3 meq/L — AB (ref 3.5–5.1)
SODIUM: 130 meq/L — AB (ref 136–145)
Total Bilirubin: 0.7 mg/dL (ref 0.20–1.20)
Total Protein: 7.1 g/dL (ref 6.4–8.3)

## 2015-12-18 LAB — CBC WITH DIFFERENTIAL/PLATELET
BASO%: 0 % (ref 0.0–2.0)
BASOS ABS: 0 10*3/uL (ref 0.0–0.1)
EOS%: 0.5 % (ref 0.0–7.0)
Eosinophils Absolute: 0.1 10*3/uL (ref 0.0–0.5)
HEMATOCRIT: 25.5 % — AB (ref 38.4–49.9)
HGB: 8.6 g/dL — ABNORMAL LOW (ref 13.0–17.1)
LYMPH#: 0.3 10*3/uL — AB (ref 0.9–3.3)
LYMPH%: 1.7 % — AB (ref 14.0–49.0)
MCH: 35.6 pg — AB (ref 27.2–33.4)
MCHC: 33.7 g/dL (ref 32.0–36.0)
MCV: 105.8 fL — ABNORMAL HIGH (ref 79.3–98.0)
MONO#: 1.2 10*3/uL — ABNORMAL HIGH (ref 0.1–0.9)
MONO%: 6.9 % (ref 0.0–14.0)
NEUT#: 16.2 10*3/uL — ABNORMAL HIGH (ref 1.5–6.5)
NEUT%: 90.9 % — AB (ref 39.0–75.0)
Platelets: 180 10*3/uL (ref 140–400)
RBC: 2.41 10*6/uL — AB (ref 4.20–5.82)
RDW: 21.1 % — ABNORMAL HIGH (ref 11.0–14.6)
WBC: 17.8 10*3/uL — ABNORMAL HIGH (ref 4.0–10.3)

## 2015-12-18 MED ORDER — DULOXETINE HCL 30 MG PO CPEP: 30.0000 mg | ORAL_CAPSULE | Freq: Every day | ORAL | Status: AC

## 2015-12-18 MED ORDER — HEPARIN SOD (PORK) LOCK FLUSH 100 UNIT/ML IV SOLN
500.0000 [IU] | Freq: Once | INTRAVENOUS | Status: AC
  Administered 2015-12-18: 500 [IU] via INTRAVENOUS
  Filled 2015-12-18: qty 5

## 2015-12-18 MED ORDER — PANTOPRAZOLE SODIUM 40 MG PO TBEC: 40.0000 mg | DELAYED_RELEASE_TABLET | Freq: Every day | ORAL | Status: AC

## 2015-12-18 MED ORDER — SODIUM CHLORIDE 0.9% FLUSH
10.0000 mL | INTRAVENOUS | Status: DC | PRN
  Administered 2015-12-18: 10 mL via INTRAVENOUS
  Filled 2015-12-18: qty 10

## 2015-12-18 NOTE — Progress Notes (Signed)
Patient in for port flush. Unable to find x-ray for tip placement. Tip placement is noted under iv assessment. Okay to access and flush per Hilario Quarry, RN

## 2015-12-18 NOTE — Telephone Encounter (Signed)
lvm for pt regarding to 7.3 labs....the patient ok and aware

## 2015-12-18 NOTE — Telephone Encounter (Signed)
Patient scheduled to see L. Marcello Moores NP today.  Will inform L. Marcello Moores of patients complaints.

## 2015-12-18 NOTE — Patient Instructions (Signed)

## 2015-12-18 NOTE — Progress Notes (Signed)
[place] OFFICE PROGRESS NOTE   Diagnosis:  Rectal cancer, prostate cancer   INTERVAL HISTORY:   [name] returns as scheduled. He continues radiation/Xeloda. He denies nausea/vomiting. No mouth sores. He continues to have loose stools. He estimates 3 or 4 a day. No hand or foot pain or redness. He continues to note blood with bowel movements. No rectal pain.  Objective:  Vital signs in last 24 hours:  Blood pressure 103/64, pulse 100, temperature 98.6 F (37 C), temperature source Oral, resp. rate 18, height 5\' 11"  (1.803 m), weight 107 lb 8 oz (48.762 kg), SpO2 100 %.    HEENT: No thrush or ulcers. Resp: Distant breath sounds. Cardio: Regular rate and rhythm. GI: No hepatomegaly. Vascular: No leg edema. Skin: Palms without erythema.  Port-A-Cath without erythema.  Lab Results:  Lab Results  Component Value Date   WBC 17.8* 12/18/2015   HGB 8.6* 12/18/2015   HCT 25.5* 12/18/2015   MCV 105.8* 12/18/2015   PLT 180 12/18/2015   NEUTROABS 16.2* 12/18/2015    Imaging:  No results found.  Medications: I have reviewed the patient's current medications.  Assessment/Plan: 1. Rectal cancer, stage IV, a lung metastasis and Metastatic lymphadenopathy on initial staging), status post A rectal biopsy 08/07/2015 , CEA 2.8 on 09/22/2014  colonoscopy 08/06/2014 confirmed a mass extending from 7-18 cm from the anus  MRI 08/19/2014 revealed a circumferential mass at the rectum, no definite extension beyond the serosal margin, no perirectal adenopathy  PET scan 09/30/2014 with an FDG avid upper rectal mass, extension into adjacent mesorectal fat versus mesorectal lymphadenopathy, FDG avid left retroperitoneal lymph nodes and an FDG avid Right upper lung groundglass nodule measuring 1.9 x 1.1 cm.  CT-guided biopsy of the right lung nodule 10/12/2014 confirmed metastatic colorectal cancer  treatment with FOLFOX/Avastin for 12 cycles completed December 2016,  oxaliplatin deleted after cycle 10 secondary to neuropathy  restaging PET scan 02/23/2015 after 6 cycles revealed local regional response and the right upper lobe nodule had almost completely resolved  PET scan 06/12/2015 after cycle 12 revealed continued improvement in the rectal mass, hypermetabolic Retroperitoneal lymphadenopathy no longer seen, hyper metabolic activity at right lung nodule had resolved  one cycle of Xeloda January 2017  CTs chest, abdomen, and pelvis 11/03/2015-clustered peripheral right upper lobe nodules, no other evidence of metastatic disease, rectal mass with large colonic stool burden, 1.6 cm presacral rim-enhancing fluid collection  Initiation of radiation/Xeloda 11/30/2015   2. Prostate cancer, PSA 11.3 09/05/2014   Prostate biopsy 09/05/2014 confirmed a Gleason 7 tumor  maintained on every three-month Lupron starting 11/23/2014   3. Iron deficiency anemia  4. 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 History of CAD, EF 15%  6. Hypertension 7. 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 Family history of colon and pancreas cancer   Disposition: Michael Wade appears stable. He continues radiation/Xeloda.  He has progressive anemia. He appears asymptomatic. The anemia is likely due to rectal bleeding. We will check ferritin and B-12. We reviewed signs/symptoms suggestive of progressive anemia. He understands to contact the office should he develop any of these. We will repeat a CBC in one week.  He will return for a follow-up visit in 2 weeks. He will contact the office in the interim as outlined above or with any other problems.  Plan reviewed with Dr. Benay Spice.  Ned Card ANP/GNP-BC   12/18/2015  2:33 PM

## 2015-12-18 NOTE — Telephone Encounter (Signed)
CALLED PATIENT TO INFORM OF NUTRITION APPT. FOR 12-27-15 @ 10:30 AM WITH BARBARA NEFF, SPOKE WITH VANESSA COOK (HIS SISTER) AND SHE IS AWARE OF THIS APPT.)

## 2015-12-18 NOTE — Telephone Encounter (Signed)
"  This is Michael Wade calling in reference to [employer].  He's had the hiccups for two to three days.  Can something be called in to [facility] in [place]?  Call me at [phone]."

## 2015-12-18 NOTE — Progress Notes (Signed)
Drawn by desk MD.

## 2015-12-19 ENCOUNTER — Other Ambulatory Visit: Payer: Managed Care, Other (non HMO)

## 2015-12-19 ENCOUNTER — Other Ambulatory Visit: Payer: Self-pay | Admitting: Radiation Oncology

## 2015-12-19 ENCOUNTER — Telehealth: Payer: Self-pay

## 2015-12-19 ENCOUNTER — Ambulatory Visit: Payer: Managed Care, Other (non HMO) | Admitting: Nurse Practitioner

## 2015-12-19 ENCOUNTER — Ambulatory Visit
Admission: RE | Admit: 2015-12-19 | Discharge: 2015-12-19 | Disposition: A | Discharge status: Home / Self Care | Payer: Medicare (Managed Care) | Source: Ambulatory Visit | Attending: Radiation Oncology | Admitting: Radiation Oncology

## 2015-12-19 ENCOUNTER — Other Ambulatory Visit: Payer: Self-pay | Admitting: Nurse Practitioner

## 2015-12-19 DIAGNOSIS — Z510 Encounter for antineoplastic radiation therapy: Secondary | ICD-10-CM | POA: Diagnosis not present

## 2015-12-19 DIAGNOSIS — C20 Malignant neoplasm of rectum: Secondary | ICD-10-CM

## 2015-12-19 DIAGNOSIS — C7800 Secondary malignant neoplasm of unspecified lung: Principal | ICD-10-CM

## 2015-12-19 MED ORDER — POTASSIUM CHLORIDE CRYS ER 20 MEQ PO TBCR: 20.0000 meq | EXTENDED_RELEASE_TABLET | Freq: Every day | ORAL | Status: DC

## 2015-12-19 MED ORDER — METOCLOPRAMIDE HCL 10 MG PO TABS: 10.0000 mg | ORAL_TABLET | Freq: Four times a day (QID) | ORAL | Status: AC

## 2015-12-19 NOTE — Progress Notes (Signed)
Patient still c/o hiccups, asked for rx, MD to e-scribe rx ot [address], informed Vanessa,sister that if it isn't there by the time they get to the pharmacy to call me, gave her my business card with phone number , she is aware of the Potassium rx to be picked up as well 12:33 PM

## 2015-12-19 NOTE — Telephone Encounter (Signed)
Spoke with Ms. Lacinda Axon, informed her of patients lab results and prescription for potassium sent to pharmacy. Informed her of repeat lab testing on 7/3. appt is already ina dn verified with ms. Cook.

## 2015-12-19 NOTE — Telephone Encounter (Signed)
-----   Message from Owens Shark, NP sent at 12/19/2015  8:59 AM EDT ----- Please let his sister know potassium mildly decreased. I sent a prescription to his pharmacy for [place] 20 meq daily. Repeat bmet on 7/3 (I ordered).

## 2015-12-19 NOTE — Telephone Encounter (Signed)
Called and left message with Camila Li to call back to go over lab results.

## 2015-12-20 ENCOUNTER — Ambulatory Visit
Admission: RE | Admit: 2015-12-20 | Discharge: 2015-12-20 | Disposition: A | Discharge status: Home / Self Care | Payer: Medicare (Managed Care) | Source: Ambulatory Visit | Attending: Radiation Oncology | Admitting: Radiation Oncology

## 2015-12-20 DIAGNOSIS — Z510 Encounter for antineoplastic radiation therapy: Secondary | ICD-10-CM | POA: Diagnosis not present

## 2015-12-21 ENCOUNTER — Ambulatory Visit
Admission: RE | Admit: 2015-12-21 | Discharge: 2015-12-21 | Disposition: A | Discharge status: Home / Self Care | Payer: Medicare (Managed Care) | Source: Ambulatory Visit | Attending: Radiation Oncology | Admitting: Radiation Oncology

## 2015-12-21 ENCOUNTER — Encounter: Payer: Self-pay | Admitting: Radiation Oncology

## 2015-12-21 ENCOUNTER — Encounter: Payer: Self-pay | Admitting: *Deleted

## 2015-12-21 DIAGNOSIS — Z510 Encounter for antineoplastic radiation therapy: Secondary | ICD-10-CM | POA: Diagnosis not present

## 2015-12-21 NOTE — Progress Notes (Signed)
Called to check [name] therapist were not sure they could treat him.  [id number] 117/70 100%.  Was uncomfortable lying flat on the treatment table pain 4/10.  Stated he felt better once he was off the treatment table table and in the wheelchair and was okay was going home and rest.

## 2015-12-21 NOTE — Progress Notes (Signed)
Prescription for Capecitabine faxed to [facility] for 7 tablets.  Radiation complete on 01/09/16.  Fax transmission complete.

## 2015-12-22 ENCOUNTER — Ambulatory Visit: Payer: Medicare (Managed Care)

## 2015-12-22 ENCOUNTER — Ambulatory Visit: Payer: 59 | Admitting: Radiation Oncology

## 2015-12-23 ENCOUNTER — Inpatient Hospital Stay (HOSPITAL_COMMUNITY)
Admission: EM | Admit: 2015-12-23 | Discharge: 2015-12-29 | DRG: 393 | Disposition: A | Discharge status: Hospice | Payer: Medicare (Managed Care) | Attending: Family Medicine | Admitting: Family Medicine

## 2015-12-23 ENCOUNTER — Encounter (HOSPITAL_COMMUNITY): Payer: Self-pay | Admitting: Emergency Medicine

## 2015-12-23 ENCOUNTER — Emergency Department (HOSPITAL_COMMUNITY): Payer: Medicare (Managed Care)

## 2015-12-23 DIAGNOSIS — I429 Cardiomyopathy, unspecified: Secondary | ICD-10-CM | POA: Diagnosis present

## 2015-12-23 DIAGNOSIS — Z85048 Personal history of other malignant neoplasm of rectum, rectosigmoid junction, and anus: Secondary | ICD-10-CM

## 2015-12-23 DIAGNOSIS — T451X5A Adverse effect of antineoplastic and immunosuppressive drugs, initial encounter: Secondary | ICD-10-CM | POA: Diagnosis present

## 2015-12-23 DIAGNOSIS — I10 Essential (primary) hypertension: Secondary | ICD-10-CM | POA: Diagnosis not present

## 2015-12-23 DIAGNOSIS — C779 Secondary and unspecified malignant neoplasm of lymph node, unspecified: Secondary | ICD-10-CM | POA: Diagnosis present

## 2015-12-23 DIAGNOSIS — C20 Malignant neoplasm of rectum: Secondary | ICD-10-CM | POA: Diagnosis present

## 2015-12-23 DIAGNOSIS — K611 Rectal abscess: Secondary | ICD-10-CM | POA: Diagnosis not present

## 2015-12-23 DIAGNOSIS — D6481 Anemia due to antineoplastic chemotherapy: Secondary | ICD-10-CM | POA: Diagnosis present

## 2015-12-23 DIAGNOSIS — E860 Dehydration: Secondary | ICD-10-CM | POA: Diagnosis not present

## 2015-12-23 DIAGNOSIS — I2510 Atherosclerotic heart disease of native coronary artery without angina pectoris: Secondary | ICD-10-CM | POA: Diagnosis present

## 2015-12-23 DIAGNOSIS — E876 Hypokalemia: Secondary | ICD-10-CM | POA: Diagnosis present

## 2015-12-23 DIAGNOSIS — Z681 Body mass index (BMI) 19 or less, adult: Secondary | ICD-10-CM

## 2015-12-23 DIAGNOSIS — C7800 Secondary malignant neoplasm of unspecified lung: Secondary | ICD-10-CM | POA: Diagnosis present

## 2015-12-23 DIAGNOSIS — Z8546 Personal history of malignant neoplasm of prostate: Secondary | ICD-10-CM

## 2015-12-23 DIAGNOSIS — R636 Underweight: Secondary | ICD-10-CM | POA: Diagnosis not present

## 2015-12-23 DIAGNOSIS — Z8674 Personal history of sudden cardiac arrest: Secondary | ICD-10-CM

## 2015-12-23 DIAGNOSIS — Z01810 Encounter for preprocedural cardiovascular examination: Secondary | ICD-10-CM | POA: Diagnosis not present

## 2015-12-23 DIAGNOSIS — E43 Unspecified severe protein-calorie malnutrition: Secondary | ICD-10-CM | POA: Diagnosis present

## 2015-12-23 DIAGNOSIS — IMO0002 Reserved for concepts with insufficient information to code with codable children: Secondary | ICD-10-CM

## 2015-12-23 DIAGNOSIS — D62 Acute posthemorrhagic anemia: Secondary | ICD-10-CM | POA: Diagnosis present

## 2015-12-23 DIAGNOSIS — Z7189 Other specified counseling: Secondary | ICD-10-CM | POA: Diagnosis not present

## 2015-12-23 DIAGNOSIS — D509 Iron deficiency anemia, unspecified: Secondary | ICD-10-CM | POA: Diagnosis present

## 2015-12-23 DIAGNOSIS — D500 Iron deficiency anemia secondary to blood loss (chronic): Secondary | ICD-10-CM | POA: Diagnosis present

## 2015-12-23 DIAGNOSIS — Z515 Encounter for palliative care: Secondary | ICD-10-CM | POA: Diagnosis present

## 2015-12-23 DIAGNOSIS — C61 Malignant neoplasm of prostate: Secondary | ICD-10-CM | POA: Diagnosis not present

## 2015-12-23 DIAGNOSIS — Z79899 Other long term (current) drug therapy: Secondary | ICD-10-CM

## 2015-12-23 DIAGNOSIS — Z950 Presence of cardiac pacemaker: Secondary | ICD-10-CM

## 2015-12-23 DIAGNOSIS — Z87891 Personal history of nicotine dependence: Secondary | ICD-10-CM

## 2015-12-23 DIAGNOSIS — R188 Other ascites: Secondary | ICD-10-CM

## 2015-12-23 DIAGNOSIS — Z95810 Presence of automatic (implantable) cardiac defibrillator: Secondary | ICD-10-CM

## 2015-12-23 DIAGNOSIS — K651 Peritoneal abscess: Secondary | ICD-10-CM | POA: Diagnosis not present

## 2015-12-23 DIAGNOSIS — Z79891 Long term (current) use of opiate analgesic: Secondary | ICD-10-CM

## 2015-12-23 DIAGNOSIS — R339 Retention of urine, unspecified: Secondary | ICD-10-CM | POA: Diagnosis present

## 2015-12-23 HISTORY — DX: Malignant neoplasm of colon, unspecified: C18.9

## 2015-12-23 LAB — COMPREHENSIVE METABOLIC PANEL
ALT: 13 U/L — AB (ref 17–63)
AST: 17 U/L (ref 15–41)
Albumin: 2.7 g/dL — ABNORMAL LOW (ref 3.5–5.0)
Alkaline Phosphatase: 98 U/L (ref 38–126)
Anion gap: 11 (ref 5–15)
BUN: 26 mg/dL — ABNORMAL HIGH (ref 6–20)
CHLORIDE: 97 mmol/L — AB (ref 101–111)
CO2: 21 mmol/L — AB (ref 22–32)
CREATININE: 0.99 mg/dL (ref 0.61–1.24)
Calcium: 8.7 mg/dL — ABNORMAL LOW (ref 8.9–10.3)
Glucose, Bld: 148 mg/dL — ABNORMAL HIGH (ref 65–99)
POTASSIUM: 3.2 mmol/L — AB (ref 3.5–5.1)
SODIUM: 129 mmol/L — AB (ref 135–145)
Total Bilirubin: 0.9 mg/dL (ref 0.3–1.2)
Total Protein: 7 g/dL (ref 6.5–8.1)

## 2015-12-23 LAB — URINALYSIS, ROUTINE W REFLEX MICROSCOPIC
Bilirubin Urine: NEGATIVE
GLUCOSE, UA: NEGATIVE mg/dL
HGB URINE DIPSTICK: NEGATIVE
Ketones, ur: NEGATIVE mg/dL
LEUKOCYTES UA: NEGATIVE
Nitrite: NEGATIVE
PH: 6 (ref 5.0–8.0)
PROTEIN: NEGATIVE mg/dL
SPECIFIC GRAVITY, URINE: 1.019 (ref 1.005–1.030)

## 2015-12-23 LAB — CBC
HEMATOCRIT: 22.5 % — AB (ref 39.0–52.0)
Hemoglobin: 8.1 g/dL — ABNORMAL LOW (ref 13.0–17.0)
MCH: 35.8 pg — AB (ref 26.0–34.0)
MCHC: 36 g/dL (ref 30.0–36.0)
MCV: 99.6 fL (ref 78.0–100.0)
Platelets: 245 10*3/uL (ref 150–400)
RBC: 2.26 MIL/uL — AB (ref 4.22–5.81)
RDW: 19.6 % — ABNORMAL HIGH (ref 11.5–15.5)
WBC: 24.6 10*3/uL — AB (ref 4.0–10.5)

## 2015-12-23 LAB — POC OCCULT BLOOD, ED: Fecal Occult Bld: POSITIVE — AB

## 2015-12-23 LAB — LIPASE, BLOOD: LIPASE: 15 U/L (ref 11–51)

## 2015-12-23 MED ORDER — SODIUM CHLORIDE 0.9 % IV SOLN
INTRAVENOUS | Status: DC
  Administered 2015-12-23: 17:00:00 via INTRAVENOUS

## 2015-12-23 MED ORDER — OXYCODONE HCL 5 MG PO TABS
5.0000 mg | ORAL_TABLET | ORAL | Status: DC | PRN
  Administered 2015-12-25 – 2015-12-29 (×10): 5 mg via ORAL
  Filled 2015-12-23 (×11): qty 1

## 2015-12-23 MED ORDER — HYDROMORPHONE HCL 1 MG/ML IJ SOLN
1.0000 mg | INTRAMUSCULAR | Status: AC | PRN
  Administered 2015-12-23 (×3): 1 mg via INTRAVENOUS
  Filled 2015-12-23 (×3): qty 1

## 2015-12-23 MED ORDER — CARVEDILOL 6.25 MG PO TABS
6.2500 mg | ORAL_TABLET | Freq: Every day | ORAL | Status: DC
  Administered 2015-12-25 – 2015-12-27 (×3): 6.25 mg via ORAL
  Filled 2015-12-23 (×3): qty 1

## 2015-12-23 MED ORDER — TAMSULOSIN HCL 0.4 MG PO CAPS
0.4000 mg | ORAL_CAPSULE | Freq: Every day | ORAL | Status: DC
  Administered 2015-12-25 – 2015-12-29 (×5): 0.4 mg via ORAL
  Filled 2015-12-23 (×5): qty 1

## 2015-12-23 MED ORDER — PIPERACILLIN-TAZOBACTAM 3.375 G IVPB
3.3750 g | Freq: Once | INTRAVENOUS | Status: AC
  Administered 2015-12-23: 3.375 g via INTRAVENOUS
  Filled 2015-12-23: qty 50

## 2015-12-23 MED ORDER — IOPAMIDOL (ISOVUE-300) INJECTION 61%
100.0000 mL | Freq: Once | INTRAVENOUS | Status: AC | PRN
  Administered 2015-12-23: 80 mL via INTRAVENOUS

## 2015-12-23 MED ORDER — ONDANSETRON HCL 4 MG PO TABS: 4.0000 mg | ORAL_TABLET | Freq: Four times a day (QID) | ORAL | Status: DC | PRN

## 2015-12-23 MED ORDER — OXYCODONE-ACETAMINOPHEN 5-325 MG PO TABS
1.0000 | ORAL_TABLET | ORAL | Status: DC | PRN
  Administered 2015-12-25 – 2015-12-29 (×9): 1 via ORAL
  Filled 2015-12-23 (×10): qty 1

## 2015-12-23 MED ORDER — DULOXETINE HCL 30 MG PO CPEP
30.0000 mg | ORAL_CAPSULE | Freq: Every day | ORAL | Status: DC
  Administered 2015-12-25 – 2015-12-29 (×5): 30 mg via ORAL
  Filled 2015-12-23 (×5): qty 1

## 2015-12-23 MED ORDER — ONDANSETRON HCL 4 MG/2ML IJ SOLN: 4.0000 mg | Freq: Four times a day (QID) | INTRAMUSCULAR | Status: DC | PRN

## 2015-12-23 MED ORDER — ACETAMINOPHEN 650 MG RE SUPP: 650.0000 mg | Freq: Four times a day (QID) | RECTAL | Status: DC | PRN

## 2015-12-23 MED ORDER — ACETAMINOPHEN 325 MG PO TABS: 650.0000 mg | ORAL_TABLET | Freq: Four times a day (QID) | ORAL | Status: DC | PRN

## 2015-12-23 MED ORDER — ONDANSETRON HCL 4 MG/2ML IJ SOLN: 4.0000 mg | Freq: Once | INTRAMUSCULAR | Status: DC

## 2015-12-23 MED ORDER — SODIUM CHLORIDE 0.9% FLUSH
3.0000 mL | Freq: Two times a day (BID) | INTRAVENOUS | Status: DC
  Administered 2015-12-25 – 2015-12-29 (×8): 3 mL via INTRAVENOUS

## 2015-12-23 MED ORDER — DIATRIZOATE MEGLUMINE & SODIUM 66-10 % PO SOLN
30.0000 mL | Freq: Once | ORAL | Status: AC
Start: 2015-12-23 — End: 2015-12-23
  Administered 2015-12-23: 30 mL via ORAL

## 2015-12-23 MED ORDER — METOCLOPRAMIDE HCL 10 MG PO TABS
10.0000 mg | ORAL_TABLET | Freq: Three times a day (TID) | ORAL | Status: DC
  Administered 2015-12-24 – 2015-12-29 (×20): 10 mg via ORAL
  Filled 2015-12-23 (×20): qty 1

## 2015-12-23 MED ORDER — OXYCODONE-ACETAMINOPHEN 10-325 MG PO TABS: 1.0000 | ORAL_TABLET | ORAL | Status: DC | PRN

## 2015-12-23 MED ORDER — VANCOMYCIN HCL IN DEXTROSE 1-5 GM/200ML-% IV SOLN
1000.0000 mg | INTRAVENOUS | Status: DC
  Administered 2015-12-24 – 2015-12-25 (×2): 1000 mg via INTRAVENOUS
  Filled 2015-12-23 (×2): qty 200

## 2015-12-23 MED ORDER — HYDROMORPHONE HCL 1 MG/ML IJ SOLN
0.5000 mg | INTRAMUSCULAR | Status: AC | PRN
  Administered 2015-12-24 – 2015-12-27 (×3): 0.5 mg via INTRAVENOUS
  Filled 2015-12-23 (×3): qty 1

## 2015-12-23 MED ORDER — ONDANSETRON HCL 4 MG/2ML IJ SOLN
4.0000 mg | Freq: Once | INTRAMUSCULAR | Status: AC | PRN
  Administered 2015-12-23: 4 mg via INTRAVENOUS
  Filled 2015-12-23: qty 2

## 2015-12-23 MED ORDER — SODIUM CHLORIDE 0.9 % IV BOLUS (SEPSIS)
1000.0000 mL | Freq: Once | INTRAVENOUS | Status: AC
  Administered 2015-12-23: 1000 mL via INTRAVENOUS

## 2015-12-23 MED ORDER — PIPERACILLIN-TAZOBACTAM 3.375 G IVPB
3.3750 g | Freq: Three times a day (TID) | INTRAVENOUS | Status: DC
  Administered 2015-12-24 – 2015-12-28 (×14): 3.375 g via INTRAVENOUS
  Filled 2015-12-23 (×14): qty 50

## 2015-12-23 MED ORDER — SODIUM CHLORIDE 0.9 % IV SOLN
INTRAVENOUS | Status: AC
  Administered 2015-12-24: via INTRAVENOUS

## 2015-12-23 NOTE — ED Provider Notes (Addendum)
CSN: [id number]     Arrival date & time 12/23/15  1529 History   First MD Initiated Contact with Patient 12/23/15 1603     Chief Complaint  Patient presents with  . Diarrhea  . Abdominal Pain   Patient is a 70 y.o. male presenting with diarrhea and abdominal pain.  Diarrhea Associated symptoms: abdominal pain   Abdominal Pain Associated symptoms: diarrhea    Pt presents with abdominal pain and constant loose stools.  He was diagnosed with rectal cancer with mets at least a year ago.   He is not a surgical candidate so he has been getting radiation and chemotherapy treatment.  He has been having constant pain that seems to be getting worse recently.  He has constant loose stools but does not have a normal BM.  No vomiting.  No fever.  He appetite has not been good the last few days and he has not been eating well.  He came to the ED because of worsening pain.  His oxycodone is not helping. Past Medical History  Diagnosis Date  . Ventricular fibrillation ([place])   . CAD (coronary artery disease)   . HTN (hypertension)   . Prostate cancer ([place])   . Rectal cancer ([place])   . Colon cancer [facility])    Past Surgical History  Procedure Laterality Date  . Left knee surgery    . Cardiac defibrillator placement     Family History  Problem Relation Age of Onset  . Prostate cancer Father    Social History  Substance Use Topics  . Smoking status: Former Smoker    Types: Cigarettes  . Smokeless tobacco: Never Used     Comment: Smoked from [id number], 1/2 PPD  . Alcohol Use: No    Review of Systems  Gastrointestinal: Positive for abdominal pain and diarrhea.  All other systems reviewed and are negative.     Allergies  Review of patient's allergies indicates no known allergies.  Home Medications   Prior to Admission medications   Medication Sig Start Date End Date Taking? Authorizing Provider  capecitabine (XELODA) 500 MG tablet Take 3 tablets (1,500 mg) QAM 2 tabs (1,000 mg) QPM on days  of radiation only (Mon-Fri) 11/08/15  Yes Ladell Pier, MD  carvedilol (COREG) 6.25 MG tablet Take 6.25 mg by mouth daily. 07/12/15 07/11/16 Yes Historical Provider, MD  DULoxetine (CYMBALTA) 30 MG capsule Take 1 capsule (30 mg total) by mouth daily. 12/18/15  Yes Owens Shark, NP  ferrous sulfate 325 (65 FE) MG tablet Take 325 mg by mouth daily with breakfast.   Yes Historical Provider, MD  furosemide (LASIX) 40 MG tablet Take 0.5 tablets (20 mg total) by mouth daily. 12/15/15  Yes Hayden Pedro, PA-C  megestrol (MEGACE) 400 MG/10ML suspension Take 5 mLs (200 mg total) by mouth 2 (two) times daily. 12/08/15  Yes Hayden Pedro, PA-C  oxyCODONE-acetaminophen (PERCOCET) 10-325 MG tablet Take 1 tablet by mouth every 4 (four) hours as needed for pain. 12/08/15  Yes Hayden Pedro, PA-C  pantoprazole (PROTONIX) 40 MG tablet Take 1 tablet (40 mg total) by mouth daily. 12/18/15 12/17/16 Yes Owens Shark, NP  potassium chloride SA (K-DUR,KLOR-CON) 20 MEQ tablet Take 1 tablet (20 mEq total) by mouth daily. 12/19/15  Yes Owens Shark, NP  psyllium (REGULOID) 0.52 g capsule Take 0.52 g by mouth as directed.   Yes Historical Provider, MD  pyridOXINE (B-6) 50 MG tablet Take 50 mg by mouth daily.  Yes Historical Provider, MD  metoCLOPramide (REGLAN) 10 MG tablet Take 1 tablet (10 mg total) by mouth 4 (four) times daily. Patient not taking: Reported on 12/23/2015 12/19/15   Kyung Rudd, MD   BP 116/75 mmHg  Pulse 97  Temp(Src) 99 F (37.2 C) (Oral)  Resp 18  SpO2 96% Physical Exam  Constitutional:  Frail, cachectic  HENT:  Head: Normocephalic and atraumatic.  Right Ear: External ear normal.  Left Ear: External ear normal.  Mouth/Throat: No oropharyngeal exudate (MM dry).  Eyes: Conjunctivae are normal. Right eye exhibits no discharge. Left eye exhibits no discharge. No scleral icterus.  Neck: Neck supple. No tracheal deviation present.  Cardiovascular: Normal rate, regular rhythm and  intact distal pulses.   Pulmonary/Chest: Effort normal and breath sounds normal. No stridor. No respiratory distress. He has no wheezes. He has no rales.  Abdominal: Soft. Bowel sounds are normal. He exhibits no distension, no ascites and no mass. There is generalized tenderness. There is no rebound and no guarding. No hernia.  Musculoskeletal: He exhibits no edema or tenderness.  Neurological: He is alert. He has normal strength. No cranial nerve deficit (no facial droop, extraocular movements intact, no slurred speech) or sensory deficit. He exhibits normal muscle tone. He displays no seizure activity. Coordination normal.  Skin: Skin is warm and dry. No rash noted. He is not diaphoretic.  Psychiatric: He has a normal mood and affect.  Nursing note and vitals reviewed.   ED Course  Procedures (including critical care time) Labs Review Labs Reviewed  COMPREHENSIVE METABOLIC PANEL - Abnormal; Notable for the following:    Sodium 129 (*)    Potassium 3.2 (*)    Chloride 97 (*)    CO2 21 (*)    Glucose, Bld 148 (*)    BUN 26 (*)    Calcium 8.7 (*)    Albumin 2.7 (*)    ALT 13 (*)    All other components within normal limits  CBC - Abnormal; Notable for the following:    WBC 24.6 (*)    RBC 2.26 (*)    Hemoglobin 8.1 (*)    HCT 22.5 (*)    MCH 35.8 (*)    RDW 19.6 (*)    All other components within normal limits  POC OCCULT BLOOD, ED - Abnormal; Notable for the following:    Fecal Occult Bld POSITIVE (*)    All other components within normal limits  LIPASE, BLOOD  URINALYSIS, ROUTINE W REFLEX MICROSCOPIC (NOT AT [facility])    Imaging Review Ct Abdomen Pelvis W Contrast  12/23/2015  CLINICAL DATA:  Abdominal pain and diarrhea for a few months. 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 EXAM: CT ABDOMEN AND PELVIS WITH CONTRAST TECHNIQUE: Multidetector CT imaging of the abdomen and pelvis was performed using  the standard protocol following bolus administration of intravenous contrast. CONTRAST:  17mL ISOVUE-300 IOPAMIDOL (ISOVUE-300) INJECTION 61% COMPARISON:  Outside CT from 11/03/2015.  Outside CT from 08/06/2014 FINDINGS: Lower chest:  Centrilobular emphysema noted in the lung bases. Hepatobiliary: Scattered tiny hypodensities in the liver parenchyma are stable. No enhancing lesion is identified within the liver parenchyma. There is no evidence for gallstones, gallbladder wall thickening, or pericholecystic fluid. Mild intrahepatic biliary duct dilatation has progressed slightly in the interval. Common bile duct in the head of the pancreas measures 8 mm in diameter today compared to 6 mm on 11/03/2015. Pancreas: Slight prominence of the main  pancreatic duct, stable. Spleen: No splenomegaly. No focal mass lesion. Adrenals/Urinary Tract: No adrenal nodule or mass. Atrophic left kidney. No evidence for enhancing renal lesion with evidence of decreased perfusion of the left kidney. No evidence for hydroureter. Bladder is markedly distended. Stomach/Bowel: Stomach is nondistended. No gastric wall thickening. No evidence of outlet obstruction. Duodenum is normally positioned as is the ligament of Treitz. Small bowel loops are nondilated. Terminal ileum unremarkable. The appendix is not visualized, but there is no edema or inflammation in the region of the cecum. Colon is diffusely distended and stool-filled from the cecum to the rectum. Vascular/Lymphatic: There is abdominal aortic atherosclerosis without aneurysm. Abdominal aorta measures up to 2.9 cm in diameter. No evidence for gastrohepatic or hepatoduodenal ligament lymphadenopathy. No abdominal retroperitoneal lymphadenopathy on the current study. No evidence for pelvic sidewall lymphadenopathy. Reproductive: The prostate gland and seminal vesicles have normal imaging features. Other: A large collection of fluid, debris, and gas is identified in the presacral space,  between the rectum and the sacrum. The main component of this complex process measures 13.5 cm in coronal diameter by 9 cm in craniocaudal diameter by 4.5 cm in AP diameter. There is extension of this process posteriorly in the right gluteus musculature, behind the right acetabulum. Gas dissects around to the right side of the rectum, displacing the rectum to the left. There is associated wall thickening in the rectum and perirectal edema/ inflammation. Musculoskeletal: Bone windows reveal no worrisome lytic or sclerotic osseous lesions. IMPRESSION: 1. Large complex collection of gas, debris, and fluid in the presacral space compatible with abscess. This dissects down around to the right of the rectum and laterally into the right gluteus musculature behind the right acetabulum. No direct communication to the rectal lumen is evident, but features are probably related to rectal perforation in this patient with a history of rectal carcinoma. 2. Markedly distended urinary bladder with the dome of the bladder cranial to the umbilicus. Imaging features are compatible with urinary retention. 3. Atrophic left kidney. 4. Scattered low-density liver lesions stable comparing back to the oldest outside imaging study of 08/06/2014. Imaging findings of potential clinical significance: Emphysema. [id number]) Aortic Atherosclerosis (ICD10-170.0) Electronically Signed   By: Misty Stanley M.D.   On: 12/23/2015 19:36   Dg Abd Acute W/chest  12/23/2015  CLINICAL DATA:  Abdominal pain for a few months. EXAM: DG ABDOMEN ACUTE W/ 1V CHEST COMPARISON:  No comparison studies available. FINDINGS: The lungs are clear wiithout focal pneumonia, edema, pneumothorax or pleural effusion. The cardiopericardial silhouette is within normal limits for size. Left-sided pacer/AICD noted. A right Port-A-Cath is visualized with distal tip position overlying the distal SVC. Old posterior right rib fractures noted. Right-side-up decubitus film shows no  evidence for intraperitoneal free air. Supine view the abdomen shows no gaseous bowel dilatation suggest obstruction. Prominent stool volume is seen along the length of the colon. Bones are diffusely demineralized. IMPRESSION: 1. No acute cardiopulmonary findings. 2. No evidence for bowel perforation or obstruction. 3. Large colonic stool volume. Imaging features could be compatible with clinical constipation. Electronically Signed   By: Misty Stanley M.D.   On: 12/23/2015 16:53   I have personally reviewed and evaluated these images and lab results as part of my medical decision-making.  Medications given in the ED Medications  sodium chloride 0.9 % bolus 1,000 mL (0 mLs Intravenous Stopped 12/23/15 1723)    And  0.9 %  sodium chloride infusion ( Intravenous New Bag/Given 12/23/15 1724)  ondansetron (ZOFRAN)  injection 4 mg (0 mg Intravenous Hold 12/23/15 1716)  piperacillin-tazobactam (ZOSYN) IVPB 3.375 g (3.375 g Intravenous New Bag/Given 12/23/15 2037)  ondansetron (ZOFRAN) injection 4 mg (4 mg Intravenous Given 12/23/15 1607)  HYDROmorphone (DILAUDID) injection 1 mg (1 mg Intravenous Given 12/23/15 2015)  iopamidol (ISOVUE-300) 61 % injection 100 mL (80 mLs Intravenous Contrast Given 12/23/15 1859)  diatrizoate meglumine-sodium (GASTROGRAFIN) 66-10 % solution 30 mL (30 mLs Oral Given 12/23/15 1902)      MDM   Final diagnoses:  Rectal cancer metastasized to lung [facility])  Abdominal abscess ([place])    He was diagnosed with rectal CA in 2016.  Pt declined surgery initially. Pt has a large rectal abscess now on CT scan.   Complicated case.  I Am not sure if he will be a surgical candidate but I will contact general surgery. I have started the patient on Zosyn. I will consult the medical service for admission.    Dorie Rank, MD 12/23/15 2059  D/w Dr Marcello Moores.   She will review the CT scans and make formal recommendations tomorrow when she sees the patient.  Dorie Rank, MD 12/23/15 2153

## 2015-12-23 NOTE — Progress Notes (Signed)
Pharmacy Antibiotic Note  Michael Wade is a 70 y.o. male admitted on 12/23/2015 with large perrectal abscess.   PMH includes colorectal cancer, HTN & CAD.  H/O rectal abscesses in the past requiring IV abx.  Patient presented with c/o back and abdominal pain.  Zosyn 3.375gm IV x 1 dose given in the ED.  Upon admission, Pharmacy has been consulted for Vancomycin & Zosyn dosing.  Plan:  Zosyn 3.375gm IV q8h (each dose infused over 4 hrs)  Vancomycin 1gm IV q24h  Vancomycin trough goal: 15-20 mcg/ml  F/U cultures/sensitivities  Follow renal function    Temp (24hrs), Avg:99 F (37.2 C), Min:99 F (37.2 C), Max:99 F (37.2 C)   Recent Labs Lab 12/18/15 1349 12/18/15 1349 12/23/15 1605  WBC 17.8*  --  24.6*  CREATININE  --  0.9 0.99    Estimated Creatinine Clearance: 49.3 mL/min (by C-G formula based on Cr of 0.99).    No Known Allergies  Antimicrobials this admission: 7/1 Zosyn >>   7/2 Vanc >>    Dose adjustments this admission:    Microbiology results: 7/1 BCx: sent 7/2 CDiff:    Thank you for allowing pharmacy to be a part of this patient's care.  Everette Rank, PharmD 12/23/2015 11:54 PM

## 2015-12-23 NOTE — ED Notes (Signed)
2nd attempt to call report, secretary stated Vanita Ingles, RN is in a contact room and call back.

## 2015-12-23 NOTE — ED Notes (Signed)
Pt c/o abdominal pain and diarrhea for "a few months." Pt has colon cancer; pt had treatment on Thursday but did not feel well enough to go yesterday. Pt sister states pt has been lethargic and not eating much past 3 days.

## 2015-12-23 NOTE — H&P (Signed)
Michael Wade [id number] DOB: 10-01-1945 DOA: 12/23/2015     PCP: Pcp Not In System   Outpatient Specialists: Oncology Benay Spice,  Patient coming from:  home Lives With family     Chief Complaint: Back and abdominal pain  HPI: Michael Wade is a 70 y.o. male with medical history significant of rectal cancer, colon cancer, prostate cancer, HTN, CAD    Presented with complaint of abdominal pain/back pain and diarrhea that has been persistent for a few months.  Patient has known history of colon cancer he hasn't been eating well. He has chronic blood per rectum secondary to colon cancer. He has known history of Rectal abscess in the past requiring IV antibiotics. 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 He states that he was not a candidate for operative intervention secondary to cardiac risk.    IN ER: Temperature 90.9 heart rate 101 respirations 18 blood pressure 116/75 WBC 24.6 which is up from prior it was 17.8 on 26 of  June in 8.1 on 13 th of June hemoglobin 8.1 which is 1 g below beginning of June creatinine 0.99 BUN 26 potassium 3.2 sodium 129   Albumin 2.7  CT scan showing large complex collection of gas dip recent fluid in. Sacral space compatible with abscess distended urinary bladder compatible with bladder obstruction or urinary retention otherwise nonacute   Hospitalist was called for admission for large perirectal abscess  Review of Systems:    Pertinent positives include:  fatigue, weight loss abdominal pain, blood in stool,  diarrhea,   Constitutional:  No weight loss, night sweats, Fevers, chills, HEENT:  No headaches, Difficulty swallowing,Tooth/dental problems,Sore throat,  No sneezing, itching, ear ache, nasal congestion, post nasal drip,  Cardio-vascular:  No chest pain, Orthopnea, PND, anasarca, dizziness, palpitations.no Bilateral lower extremity swelling  GI:  No heartburn, indigestion, nausea, vomitingchange in bowel habits, loss of appetite, melena, hematemesis Resp:  no shortness of breath at rest. No dyspnea on exertion, No excess mucus, no productive cough, No non-productive cough, No coughing up of blood.No change in color of mucus.No wheezing. Skin:  no rash or lesions. No jaundice GU:  no dysuria, change in color of urine, no urgency or frequency. No straining to urinate.  No flank pain.  Musculoskeletal:  No joint pain or no joint swelling. No decreased range of motion. No back pain.  Psych:  No change in mood or affect. No depression or anxiety. No memory loss.  Neuro: no localizing neurological complaints, no tingling, no weakness, no double vision,  no gait abnormality, no slurred speech, no confusion  As per HPI otherwise 10 point review of systems negative.   Past Medical History: Past Medical History  Diagnosis Date  . Ventricular fibrillation ([place])   . CAD (coronary artery disease)   . HTN (hypertension)   . Prostate cancer ([place])   . Rectal cancer ([place])     . Colon cancer [facility])    Past Surgical History  Procedure Laterality Date  . Left knee surgery    . Cardiac defibrillator placement       Social History:  Ambulatory  [name]     reports that he has quit smoking. His smoking use included Cigarettes. He has never used smokeless tobacco. He reports that he does not drink alcohol or use illicit drugs.  Allergies:  No Known Allergies     Family History:    Family History  Problem Relation Age of Onset  . Prostate cancer Father     Medications: Prior to Admission medications   Medication Sig Start Date End Date Taking? Authorizing Provider  capecitabine (XELODA) 500 MG tablet Take 3 tablets (1,500 mg) QAM 2 tabs (1,000 mg) QPM on days of radiation only (Mon-Fri) 11/08/15  Yes Ladell Pier, MD  carvedilol (COREG) 6.25 MG tablet Take 6.25 mg by mouth daily. 07/12/15 07/11/16 Yes Historical Provider, MD  DULoxetine (CYMBALTA) 30 MG capsule Take 1 capsule (30 mg total) by mouth daily. 12/18/15  Yes Owens Shark, NP  ferrous sulfate 325 (65 FE) MG tablet Take 325 mg by mouth daily with breakfast.   Yes Historical Provider, MD  furosemide (LASIX) 40 MG tablet Take 0.5 tablets (20 mg total) by mouth daily. 12/15/15  Yes Hayden Pedro, PA-C  megestrol (MEGACE) 400 MG/10ML suspension Take 5 mLs (200 mg total) by mouth 2 (two) times daily. 12/08/15  Yes Hayden Pedro, PA-C  oxyCODONE-acetaminophen (PERCOCET) 10-325 MG tablet Take 1 tablet by mouth every 4 (four) hours as needed for pain. 12/08/15  Yes Hayden Pedro, PA-C  pantoprazole (PROTONIX) 40 MG tablet Take 1 tablet (40 mg total) by mouth daily. 12/18/15 12/17/16 Yes Owens Shark, NP  potassium chloride SA (K-DUR,KLOR-CON) 20 MEQ tablet Take 1 tablet (20 mEq total) by mouth daily. 12/19/15  Yes Owens Shark, NP  psyllium (REGULOID) 0.52 g capsule Take 0.52 g by mouth as directed.   Yes Historical Provider, MD  pyridOXINE (B-6) 50 MG tablet Take 50 mg by mouth  daily.   Yes Historical Provider, MD  metoCLOPramide (REGLAN) 10 MG tablet Take 1 tablet (10 mg total) by mouth 4 (four) times daily. Patient not taking: Reported on 12/23/2015 12/19/15   Kyung Rudd, MD    Physical Exam: Patient Vitals for the past 24 hrs:  BP Temp Temp src Pulse Resp SpO2  12/23/15 1923 116/75 mmHg - - 97 18 96 %  12/23/15 1538 129/71 mmHg 99 F (37.2 C) Oral 101 18 100 %    1. General:  in No Acute distress 2. Psychological: Alert and   Oriented 3. Head/ENT:     Dry Mucous Membranes                          Head Non traumatic, neck supple                            Poor Dentition 4. SKIN: decreased Skin turgor,  Skin clean Dry  and intact no rash 5. Heart: Regular rate and rhythm no Murmur, Rub or gallop 6. Lungs:   no wheezes or crackles   7. Abdomen: Soft, tender,  distended 8. Lower extremities: no clubbing, cyanosis, or edema 9. Neurologically Grossly intact, moving all 4 extremities equally 10. MSK: Normal range of motion   body mass index is unknown because there is no weight on file.  Labs on Admission:   Labs on Admission: I have personally reviewed following labs and imaging studies  CBC:  Recent Labs Lab 12/18/15 1349 12/23/15 1605  WBC 17.8* 24.6*  NEUTROABS 16.2*  --   HGB 8.6* 8.1*  HCT 25.5* 22.5*  MCV 105.8* 99.6  PLT 180 99991111   Basic Metabolic Panel:  Recent Labs Lab 12/18/15 1349 12/23/15 1605  NA 130* 129*  K 3.3* 3.2*  CL  --  97*  CO2 20* 21*  GLUCOSE 112 148*  BUN 13.5 26*  CREATININE 0.9 0.99  CALCIUM 9.4 8.7*   GFR: Estimated Creatinine Clearance: 49.3 mL/min (by C-G formula based on Cr of 0.99). Liver Function Tests:  Recent Labs Lab 12/18/15 1349 12/23/15 1605  AST 11 17  ALT 10 13*  ALKPHOS 117 98  BILITOT 0.70 0.9  PROT 7.1 7.0  ALBUMIN 2.6* 2.7*    Recent Labs Lab 12/23/15 1605  LIPASE 15   No results for input(s): AMMONIA in the last 168 hours. Coagulation Profile: No results for input(s):  INR, PROTIME in the last 168 hours. Cardiac Enzymes: No results for input(s): CKTOTAL, CKMB, CKMBINDEX, TROPONINI in the last 168 hours. BNP (last 3 results) No results for input(s): PROBNP in the last 8760 hours. HbA1C: No results for input(s): HGBA1C in the last 72 hours. CBG: No results for input(s): GLUCAP in the last 168 hours. Lipid Profile: No results for input(s): CHOL, HDL, LDLCALC, TRIG, CHOLHDL, LDLDIRECT in the last 72 hours. Thyroid Function Tests: No results for input(s): TSH, T4TOTAL, FREET4, T3FREE, THYROIDAB in the last 72 hours. Anemia Panel: No results for input(s): VITAMINB12, FOLATE, FERRITIN, TIBC, IRON, RETICCTPCT in the last 72 hours. Urine analysis:    Component Value Date/Time   COLORURINE YELLOW 12/23/2015 2020   APPEARANCEUR CLEAR 12/23/2015 2020   LABSPEC 1.019 12/23/2015 2020   PHURINE 6.0 12/23/2015 2020   GLUCOSEU NEGATIVE 12/23/2015 2020   HGBUR NEGATIVE 12/23/2015 2020   BILIRUBINUR NEGATIVE 12/23/2015 2020   KETONESUR NEGATIVE 12/23/2015 2020   PROTEINUR NEGATIVE 12/23/2015 2020   NITRITE NEGATIVE 12/23/2015 2020   LEUKOCYTESUR NEGATIVE 12/23/2015 2020   Sepsis Labs: @LABRCNTIP (procalcitonin:4,lacticidven:4) )No results found for this or any previous visit (from the past 240 hour(s)).    UA  no evidence of UTI  No results found for: HGBA1C  Estimated Creatinine Clearance: 49.3 mL/min (by C-G formula based on Cr of 0.99).  BNP (last 3 results) No results for input(s): PROBNP in the last 8760 hours.   ECG REPORT Not obtained  There were no vitals filed for this visit.   Cultures: No results found for: SDES, SPECREQUEST, CULT, REPTSTATUS   Radiological Exams on Admission: Ct Abdomen Pelvis W Contrast  12/23/2015  CLINICAL DATA:  Abdominal pain and diarrhea for a few months. 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 EXAM: CT ABDOMEN AND PELVIS WITH CONTRAST  TECHNIQUE: Multidetector CT imaging of the abdomen and pelvis was performed using the standard protocol following bolus administration of intravenous contrast. CONTRAST:  44mL ISOVUE-300 IOPAMIDOL (ISOVUE-300) INJECTION  61% COMPARISON:  Outside CT from 11/03/2015.  Outside CT from 08/06/2014 FINDINGS: Lower chest:  Centrilobular emphysema noted in the lung bases. Hepatobiliary: Scattered tiny hypodensities in the liver parenchyma are stable. No enhancing lesion is identified within the liver parenchyma. There is no evidence for gallstones, gallbladder wall thickening, or pericholecystic fluid. Mild intrahepatic biliary duct dilatation has progressed slightly in the interval. Common bile duct in the head of the pancreas measures 8 mm in diameter today compared to 6 mm on 11/03/2015. Pancreas: Slight prominence of the main pancreatic duct, stable. Spleen: No splenomegaly. No focal mass lesion. Adrenals/Urinary Tract: No adrenal nodule or mass. Atrophic left kidney. No evidence for enhancing renal lesion with evidence of decreased perfusion of the left kidney. No evidence for hydroureter. Bladder is markedly distended. Stomach/Bowel: Stomach is nondistended. No gastric wall thickening. No evidence of outlet obstruction. Duodenum is normally positioned as is the ligament of Treitz. Small bowel loops are nondilated. Terminal ileum unremarkable. The appendix is not visualized, but there is no edema or inflammation in the region of the cecum. Colon is diffusely distended and stool-filled from the cecum to the rectum. Vascular/Lymphatic: There is abdominal aortic atherosclerosis without aneurysm. Abdominal aorta measures up to 2.9 cm in diameter. No evidence for gastrohepatic or hepatoduodenal ligament lymphadenopathy. No abdominal retroperitoneal lymphadenopathy on the current study. No evidence for pelvic sidewall lymphadenopathy. Reproductive: The prostate gland and seminal vesicles have normal imaging features. Other: A  large collection of fluid, debris, and gas is identified in the presacral space, between the rectum and the sacrum. The main component of this complex process measures 13.5 cm in coronal diameter by 9 cm in craniocaudal diameter by 4.5 cm in AP diameter. There is extension of this process posteriorly in the right gluteus musculature, behind the right acetabulum. Gas dissects around to the right side of the rectum, displacing the rectum to the left. There is associated wall thickening in the rectum and perirectal edema/ inflammation. Musculoskeletal: Bone windows reveal no worrisome lytic or sclerotic osseous lesions. IMPRESSION: 1. Large complex collection of gas, debris, and fluid in the presacral space compatible with abscess. This dissects down around to the right of the rectum and laterally into the right gluteus musculature behind the right acetabulum. No direct communication to the rectal lumen is evident, but features are probably related to rectal perforation in this patient with a history of rectal carcinoma. 2. Markedly distended urinary bladder with the dome of the bladder cranial to the umbilicus. Imaging features are compatible with urinary retention. 3. Atrophic left kidney. 4. Scattered low-density liver lesions stable comparing back to the oldest outside imaging study of 08/06/2014. Imaging findings of potential clinical significance: Emphysema. [id number]) Aortic Atherosclerosis (ICD10-170.0) Electronically Signed   By: Misty Stanley M.D.   On: 12/23/2015 19:36   Dg Abd Acute W/chest  12/23/2015  CLINICAL DATA:  Abdominal pain for a few months. EXAM: DG ABDOMEN ACUTE W/ 1V CHEST COMPARISON:  No comparison studies available. FINDINGS: The lungs are clear wiithout focal pneumonia, edema, pneumothorax or pleural effusion. The cardiopericardial silhouette is within normal limits for size. Left-sided pacer/AICD noted. A right Port-A-Cath is visualized with distal tip position overlying the distal  SVC. Old posterior right rib fractures noted. Right-side-up decubitus film shows no evidence for intraperitoneal free air. Supine view the abdomen shows no gaseous bowel dilatation suggest obstruction. Prominent stool volume is seen along the length of the colon. Bones are diffusely demineralized. IMPRESSION: 1. No acute cardiopulmonary findings. 2. No evidence  for bowel perforation or obstruction. 3. Large colonic stool volume. Imaging features could be compatible with clinical constipation. Electronically Signed   By: Misty Stanley M.D.   On: 12/23/2015 16:53    Chart has been reviewed    Assessment/Plan   70 y.o. male with medical history significant of rectal cancer and a static tube along and complicated by perirectal abscess in the past requiring IV antibiotics chronic blood loss anemia requiring blood transfusions and chronic diarrhea, colon cancer, prostate cancer, HTN, CAD presents with  back pain and abdominal pain was found to have leukocytosis and large perirectal abscess and urinary retention   Present on Admission:  . Rectal cancer metastasized to lung [facility]) appreciate oncology input patient with very poor prognosis appreciate overall goals of care discussion. Spoke to Dr. Burr Medico who will see in AM. Defer to oncology if we'll need to continue chemotherapy hold for tonight. Overall poor performance status, poor prognosis and large infection with severe lumbar, we'll order palliative care consult discussed this with family and oncology who agrees  . Abscess, rectum appreciate surgery input  continue IV Zosyn and vancomycin, interventional radiology may be a better option given overall poor performance status and debility.  Dehydration - will rehydrate . Prostate cancer ([place])-  stable  . CAD (coronary artery disease) - unclear if ever followed up by cardiology. Status post pacemaker for ventricular fibrillation cardiac arrest. In order to father evaluate for any possible operative  interventions will repeat echogram. Obtain baseline EKG.  . Hypertension stable continue home medications  . Urinary retention -likely causes of abdominal pain currently improved now status post Foley catheter recent will need to follow up with urology patient has known history of prostate cancer. Will start on Flomax for now Malnutrition and poor by mouth intake. We'll check prealbumin and have nutritional consult Chronic rectal bleeding secondary to rectal cancer resulting in anemia or blood loss Blood loss anemia - obtain type and screen for transfuse if hemoglobin below 7 or symptomatic Irritated once patient's clinical status improves will need Pt/OT evaluation to see if he may benefit from placement Other plan as per orders.  DVT prophylaxis:  SCD    Code Status:  FULL CODE as per family but they  are agreeable to palliative care consult  Family Communication:   Family not at  Bedside Spoke on the phone with Sister Camila Li [phone]  Disposition Plan:   likely will need placement for rehabilitation                           Consults called: Dr. Grandville Silos general , Dr. Burr Medico Oncology, Palliative care consult (epic), IR (epic)  Admission status:  Inpatient     Level of care    SDU     [place] 12/23/2015, 10:12 PM    [facility]  Pager [phone]   after 2 AM please page floor coverage PA If 7AM-7PM, please contact the day team taking care of the patient  [email or web address]  Password [email or web address]

## 2015-12-23 NOTE — ED Notes (Signed)
Attempted report x1. 

## 2015-12-24 ENCOUNTER — Encounter (HOSPITAL_COMMUNITY): Payer: Self-pay | Admitting: Physician Assistant

## 2015-12-24 ENCOUNTER — Inpatient Hospital Stay (HOSPITAL_COMMUNITY): Payer: Medicare (Managed Care)

## 2015-12-24 DIAGNOSIS — I2510 Atherosclerotic heart disease of native coronary artery without angina pectoris: Secondary | ICD-10-CM

## 2015-12-24 DIAGNOSIS — Z7189 Other specified counseling: Secondary | ICD-10-CM | POA: Diagnosis not present

## 2015-12-24 DIAGNOSIS — K651 Peritoneal abscess: Secondary | ICD-10-CM

## 2015-12-24 DIAGNOSIS — C61 Malignant neoplasm of prostate: Secondary | ICD-10-CM

## 2015-12-24 DIAGNOSIS — C7800 Secondary malignant neoplasm of unspecified lung: Secondary | ICD-10-CM

## 2015-12-24 DIAGNOSIS — IMO0002 Reserved for concepts with insufficient information to code with codable children: Secondary | ICD-10-CM | POA: Diagnosis present

## 2015-12-24 DIAGNOSIS — C20 Malignant neoplasm of rectum: Secondary | ICD-10-CM

## 2015-12-24 DIAGNOSIS — Z515 Encounter for palliative care: Secondary | ICD-10-CM | POA: Diagnosis not present

## 2015-12-24 LAB — COMPREHENSIVE METABOLIC PANEL
ALBUMIN: 2.4 g/dL — AB (ref 3.5–5.0)
ALK PHOS: 83 U/L (ref 38–126)
ALT: 10 U/L — AB (ref 17–63)
AST: 13 U/L — AB (ref 15–41)
Anion gap: 8 (ref 5–15)
BILIRUBIN TOTAL: 0.9 mg/dL (ref 0.3–1.2)
BUN: 20 mg/dL (ref 6–20)
CALCIUM: 8.4 mg/dL — AB (ref 8.9–10.3)
CO2: 22 mmol/L (ref 22–32)
Chloride: 103 mmol/L (ref 101–111)
Creatinine, Ser: 0.93 mg/dL (ref 0.61–1.24)
GFR calc Af Amer: >60 mL/min (ref >60)
GFR calc non Af Amer: >60 mL/min (ref >60)
GLUCOSE: 123 mg/dL — AB (ref 65–99)
POTASSIUM: 3.2 mmol/L — AB (ref 3.5–5.1)
Sodium: 133 mmol/L — ABNORMAL LOW (ref 135–145)
TOTAL PROTEIN: 6.3 g/dL — AB (ref 6.5–8.1)

## 2015-12-24 LAB — ECHOCARDIOGRAM COMPLETE
CHL CUP MV DEC (S): 144
E decel time: 144 msec
E/e' ratio: 5.2
FS: 18 % — AB (ref 28–44)
HEIGHTINCHES: 71 in
IVS/LV PW RATIO, ED: 1.13
LA diam end sys: 27 mm
LADIAMINDEX: 1.62 cm/m2
LASIZE: 27 mm
LAVOLA4C: 17.6 mL
LV E/e'average: 5.2
LV PW d: 9.27 mm — AB (ref 0.6–1.1)
LV TDI E'LATERAL: 12.3
LV TDI E'MEDIAL: 6.09
LV e' LATERAL: 12.3 cm/s
LVEEMED: 5.2
LVOT area: 3.46 cm2
LVOT diameter: 21 mm
MV pk A vel: 92.2 m/s
MV pk E vel: 64 m/s
WEIGHTICAEL: 1837.75 [oz_av]

## 2015-12-24 LAB — MAGNESIUM: Magnesium: 1.5 mg/dL — ABNORMAL LOW (ref 1.7–2.4)

## 2015-12-24 LAB — CBC
HEMATOCRIT: 19.6 % — AB (ref 39.0–52.0)
Hemoglobin: 7.1 g/dL — ABNORMAL LOW (ref 13.0–17.0)
MCH: 36.8 pg — AB (ref 26.0–34.0)
MCHC: 36.2 g/dL — ABNORMAL HIGH (ref 30.0–36.0)
MCV: 101.6 fL — ABNORMAL HIGH (ref 78.0–100.0)
Platelets: 209 10*3/uL (ref 150–400)
RBC: 1.93 MIL/uL — ABNORMAL LOW (ref 4.22–5.81)
RDW: 19.8 % — ABNORMAL HIGH (ref 11.5–15.5)
WBC: 21.7 10*3/uL — ABNORMAL HIGH (ref 4.0–10.5)

## 2015-12-24 LAB — C DIFFICILE QUICK SCREEN W PCR REFLEX
C Diff antigen: NEGATIVE
C Diff interpretation: NEGATIVE
C Diff toxin: NEGATIVE

## 2015-12-24 LAB — TROPONIN I
TROPONIN I: 0.32 ng/mL — AB (ref <0.03)
TROPONIN I: 0.33 ng/mL — AB (ref <0.03)
Troponin I: 0.34 ng/mL (ref <0.03)
Troponin I: 0.23 ng/mL (ref <0.03)

## 2015-12-24 LAB — ABO/RH: ABO/RH(D): O POS

## 2015-12-24 LAB — PROTIME-INR
INR: 1.46 (ref 0.00–1.49)
PROTHROMBIN TIME: 17.3 s — AB (ref 11.6–15.2)

## 2015-12-24 LAB — PREALBUMIN: Prealbumin: 6.3 mg/dL — ABNORMAL LOW (ref 18–38)

## 2015-12-24 LAB — BRAIN NATRIURETIC PEPTIDE: B NATRIURETIC PEPTIDE 5: 260.5 pg/mL — AB (ref 0.0–100.0)

## 2015-12-24 LAB — TSH: TSH: 0.472 u[IU]/mL (ref 0.350–4.500)

## 2015-12-24 LAB — PHOSPHORUS: Phosphorus: 2.1 mg/dL — ABNORMAL LOW (ref 2.5–4.6)

## 2015-12-24 MED ORDER — FENTANYL CITRATE (PF) 100 MCG/2ML IJ SOLN
INTRAMUSCULAR | Status: AC | PRN
  Administered 2015-12-24 (×2): 25 ug via INTRAVENOUS

## 2015-12-24 MED ORDER — MIDAZOLAM HCL 2 MG/2ML IJ SOLN
INTRAMUSCULAR | Status: AC | PRN
  Administered 2015-12-24 (×2): 1 mg via INTRAVENOUS

## 2015-12-24 MED ORDER — FENTANYL CITRATE (PF) 100 MCG/2ML IJ SOLN
INTRAMUSCULAR | Status: AC
  Filled 2015-12-24: qty 4

## 2015-12-24 MED ORDER — SODIUM CHLORIDE 0.9% FLUSH
10.0000 mL | INTRAVENOUS | Status: DC | PRN
  Administered 2015-12-24 – 2015-12-27 (×3): 10 mL
  Filled 2015-12-24 (×3): qty 40

## 2015-12-24 MED ORDER — MIDAZOLAM HCL 2 MG/2ML IJ SOLN
INTRAMUSCULAR | Status: AC
  Filled 2015-12-24: qty 6

## 2015-12-24 NOTE — Consult Note (Signed)
Consultation Note Date: 12/24/2015   Patient Name: Michael Wade  DOB: 1945/12/05  MRN: [id number]  Age / Sex: 70 y.o., male  PCP: Pcp Not In System Referring Physician: Annita Brod, MD  Reason for Consultation: Establishing goals of care  HPI/Patient Profile: 70 y.o. male admitted on 12/23/2015   Clinical Assessment and Goals of Care:  70 year old gentleman originally from [place]. No spouse. Was diagnosed with cancer 16 months ago. Sister Michael Wade moved him to [facility] to take care of him. He lives with his sister Michael Wade and his mother. 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 Also with reported history of prostate cancer coronary artery disease. Status post ICD placement last year. Patient complained of abdominal pain and back pain and diarrhea that has been persistent for the past few months. He has not been eating well. At times he has had chronic blood loss per rectum. No history of fever no history of nausea vomiting. Abdominal imaging in the emergency department showed a large complex collection of gas, debris, fluid and sacral space compatible with abscess. Patient has been admitted to hospitalist service since 7-1. He has been seen by general surgery and cardiology and now interventional radiology. At present, plan is to proceed with CT-guided drainage/drain placement today. He has been placed on antibiotics since admission. His pain is controlled. From oncology standpoint he was on radiation and was on Xeloda pills for rectal cancer. 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 He has been seen by general surgery and this admission. Final recommendations after interventional radiology transgluteal drain placement are to consider possible loop colostomy in the  operating room for divergent. Await cardiology recommendations. Surface echocardiogram done. Palliative care consultation for goals of care discussions.  Patient is a thin elderly appearing gentleman resting in bed. He answers very few yes/no type questions appropriately. He is not able to participate in detail discussions. Patient's sister primary decision maker Michael Wade present at the bedside. Detailed discussions with sister outside the room. I introduce myself and palliative care as follows:  Palliative medicine is specialized medical care for people living with serious illness. It focuses on providing relief from the symptoms and stress of a serious illness. The goal is to improve quality of life for both the patient and the family.  Discussed frankly that the patient has several serious life limiting conditions. Discussed about the patient's ongoing declining functional status and poor prognosis. Patient's sister becomes tearful. See recommendations below.  NEXT OF KIN  sister Michael Wade at [id number]  Also has mother No spouse no kids.   SUMMARY OF RECOMMENDATIONS    full code, sister would want continuation of ICD for now, would also want intubation and mechanical ventilation if deemed necessary.    IR for CT guided drainage/drain placement today for large complex collection of gas, debris, fluid in pre sacral space compatible with abscess.  Sister states she wishes to discuss with cardiology regarding their recommendations for surgery.   Goals are to continue with any and all life maintaining/life prolonging therapies. Gentle palliative discussions initiated how ever. Sister becomes tearful, recognizes patient is up against a lot of things at the moment. We will continue to follow along and help guide appropriate decision making. Thank you for the consult.   Code Status/Advance Care Planning:  Full code Has ICD   Symptom Management:   Continue current measures,  continue to monitor.   Palliative Prophylaxis:   Delirium Protocol  Additional Recommendations (Limitations, Scope, Preferences):  Full Scope Treatment  Psycho-social/Spiritual:   Desire for further Chaplaincy support:no  Additional Recommendations: Caregiving  Support/Resources  Prognosis:   Unable to determine  Discharge Planning: To Be Determined      Primary Diagnoses: Present on Admission:  . Rectal cancer metastasized to lung ([place]) . Abscess, rectum . Prostate cancer ([place]) . CAD (coronary artery disease) . Hypertension . Urinary retention . Abscess, perirectal . Dehydration  I have reviewed the medical record, interviewed the patient and family, and examined the patient. The following aspects are pertinent.  Past Medical History  Diagnosis Date  . 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 CAD (coronary artery disease)   . HTN (hypertension)   . Prostate cancer ([place])   . Rectal cancer ([place])   . Colon cancer [facility])    Social History   Social History  . Marital Status: Single    Spouse Name: N/A  . Number of Children: 0  . Years of Education: N/A   Occupational History  . retired     Personal assistant   Social History Main Topics  . Smoking status: Former Smoker    Types: Cigarettes  . Smokeless tobacco: Never Used     Comment: Smoked from [id number], 1/2 PPD  . Alcohol Use: No  . Drug Use: No  . Sexual Activity: Not Asked   Other Topics Concern  . None   Social History Narrative   Single, no children   Retired from Personal assistant   Lives w/sister and his mom   Ambulates-has walker--FALL RISK   Family History  Problem Relation Age of Onset  . Prostate cancer Father    Scheduled Meds: . carvedilol  6.25 mg Oral Q breakfast  . DULoxetine  30 mg Oral Daily  . metoCLOPramide  10 mg Oral TID AC & HS  . ondansetron (ZOFRAN) IV  4 mg Intravenous Once  . piperacillin-tazobactam (ZOSYN)  IV  3.375 g Intravenous Q8H  . sodium chloride flush  3 mL  Intravenous Q12H  . tamsulosin  0.4 mg Oral Daily  . vancomycin  1,000 mg Intravenous Q24H   Continuous Infusions:  PRN Meds:.acetaminophen **OR** acetaminophen, HYDROmorphone (DILAUDID) injection, ondansetron **OR** ondansetron (ZOFRAN) IV, oxyCODONE-acetaminophen **AND** oxyCODONE Medications Prior to Admission:  Prior to Admission medications   Medication Sig Start Date End Date Taking? Authorizing Provider  capecitabine (XELODA) 500 MG tablet Take 3 tablets (1,500 mg) QAM 2 tabs (1,000 mg) QPM on days of radiation only (Mon-Fri) 11/08/15  Yes Ladell Pier, MD  carvedilol (COREG) 6.25 MG tablet Take 6.25 mg by mouth daily. 07/12/15 07/11/16 Yes Historical Provider, MD  DULoxetine (CYMBALTA) 30 MG capsule Take 1 capsule (30 mg total) by mouth daily. 12/18/15  Yes Owens Shark, NP  ferrous sulfate 325 (65 FE) MG tablet Take 325 mg by mouth daily with breakfast.   Yes Historical Provider, MD  furosemide (LASIX) 40 MG  tablet Take 0.5 tablets (20 mg total) by mouth daily. 12/15/15  Yes Hayden Pedro, PA-C  megestrol (MEGACE) 400 MG/10ML suspension Take 5 mLs (200 mg total) by mouth 2 (two) times daily. 12/08/15  Yes Hayden Pedro, PA-C  oxyCODONE-acetaminophen (PERCOCET) 10-325 MG tablet Take 1 tablet by mouth every 4 (four) hours as needed for pain. 12/08/15  Yes Hayden Pedro, PA-C  pantoprazole (PROTONIX) 40 MG tablet Take 1 tablet (40 mg total) by mouth daily. 12/18/15 12/17/16 Yes Owens Shark, NP  potassium chloride SA (K-DUR,KLOR-CON) 20 MEQ tablet Take 1 tablet (20 mEq total) by mouth daily. 12/19/15  Yes Owens Shark, NP  psyllium (REGULOID) 0.52 g capsule Take 0.52 g by mouth as directed.   Yes Historical Provider, MD  pyridOXINE (B-6) 50 MG tablet Take 50 mg by mouth daily.   Yes Historical Provider, MD  metoCLOPramide (REGLAN) 10 MG tablet Take 1 tablet (10 mg total) by mouth 4 (four) times daily. Patient not taking: Reported on 12/23/2015 12/19/15   Kyung Rudd, MD    No Known Allergies Review of Systems + for weakness, decreased appetite  Physical Exam Weak appearing gentleman Thin cachectic resting in bed S1-S2 Lungs clear Abdomen mild distention Extremities thin, muscle wasting, no edema Awake alert answers a few questions appropriately  Vital Signs: BP 103/50 mmHg  Pulse 90  Temp(Src) 98.6 F (37 C) (Oral)  Resp 14  Ht 5\' 11"  (1.803 m)  Wt 52.1 kg (114 lb 13.8 oz)  BMI 16.03 kg/m2  SpO2 100% Pain Assessment: No/denies pain   Pain Score: 0-No pain   SpO2: SpO2: 100 % O2 Device:SpO2: 100 % O2 Flow Rate: .   IO: Intake/output summary:  Intake/Output Summary (Last 24 hours) at 12/24/15 1011 Last data filed at 12/24/15 0846  Gross per 24 hour  Intake  437.5 ml  Output   1600 ml  Net -1162.5 ml    LBM: Last BM Date: 12/24/15 Baseline Weight: Weight: 52.1 kg (114 lb 13.8 oz) Most recent weight: Weight: 52.1 kg (114 lb 13.8 oz)     Palliative Assessment/Data:   Flowsheet Rows        Most Recent Value   Intake Tab    Referral Department  Hospitalist   Unit at Time of Referral  Oncology Unit   Palliative Care Primary Diagnosis  Cancer   Palliative Care Type  New Palliative care   Reason for referral  Clarify Goals of Care   Date first seen by Palliative Care  12/24/15   Clinical Assessment    Palliative Performance Scale Score  30%   Pain Max last 24 hours  5   Pain Min Last 24 hours  4   Psychosocial & Spiritual Assessment    Palliative Care Outcomes    Patient/Family meeting held?  Yes   Who was at the meeting?  sister Michael Wade       Time In: 9 am  Time Out: 10 Time Total: 60 min  Greater than 50%  of this time was spent counseling and coordinating care related to the above assessment and plan.  Signed by: Loistine Chance, MD  [phone]   Please contact Palliative Medicine Team phone at [phone] for questions and concerns.  For individual provider: See Shea Evans

## 2015-12-24 NOTE — Progress Notes (Signed)
Michael Wade   DOB:1946-03-01   [id number]   [id number]  Oncology follow-up  Subjective: I'm covering my partner Dr. Learta Codding to see this patient. He was diagnosed with metastatic rectal cancer to lung in February 2016, status post FOLFOX and Avastin chemotherapy with excellent response. He transferred his oncology care from [facility] to [place] in May 2017, and is currently undergoing concurrent chemoradiation to rectal cancer, with a plan to do S/P RT to his limited metastasis, to maximally controlled his cancer. He presented with worsening abdominal pain, urinary retention, and sacral abscess, was admitted from eating yesterday. He currently lives with his sister (his caregiver) and her mother, his general condition declined quickly in the past 3-5 days, with very poor appetite and has been in bed most of time. He missed his radiation on Friday 6/30, he has 12 fractions left. He is quite drowsy when I saw him, and I discussed with his sister at the bedside.  Objective:  Filed Vitals:   12/24/15 0900 12/24/15 0956  BP: 93/50 103/50  Pulse:  90  Temp:  98.6 F (37 C)  Resp:  14    Body mass index is 16.03 kg/(m^2).  Intake/Output Summary (Last 24 hours) at 12/24/15 1210 Last data filed at 12/24/15 0846  Gross per 24 hour  Intake  437.5 ml  Output   1600 ml  Net -1162.5 ml     Sclerae unicteric  Oropharynx clear  No peripheral adenopathy  Lungs clear -- no rales or rhonchi  Heart regular rate and rhythm  Abdomen Diffuse mild tenderness in the mid abdomen, bowel sounds normal  MSK no focal spinal tenderness, no peripheral edema  Neuro nonfocal   CBG (last 3)  No results for input(s): GLUCAP in the last 72 hours.   Labs:  Lab Results  Component Value Date   WBC 21.7* 12/24/2015   HGB 7.1* 12/24/2015   HCT 19.6* 12/24/2015   MCV 101.6* 12/24/2015   PLT 209 12/24/2015   NEUTROABS 16.2* 12/18/2015    @LASTCHEMISTRY @  Urine Studies No results for input(s): UHGB, CRYS in  the last 72 hours.  Invalid input(s): UACOL, UAPR, USPG, UPH, UTP, UGL, UKET, UBIL, UNIT, UROB, ULEU, UEPI, UWBC, [place], [place], [place], [place], [place]  Basic Metabolic Panel:  Recent Labs Lab 12/18/15 1349 12/23/15 1605 12/24/15 0528  NA 130* 129* 133*  K 3.3* 3.2* 3.2*  CL  --  97* 103  CO2 20* 21* 22  GLUCOSE 112 148* 123*  BUN 13.5 26* 20  CREATININE 0.9 0.99 0.93  CALCIUM 9.4 8.7* 8.4*  MG  --   --  1.5*  PHOS  --   --  2.1*   GFR Estimated Creatinine Clearance: 56 mL/min (by C-G formula based on Cr of 0.93). Liver Function Tests:  Recent Labs Lab 12/18/15 1349 12/23/15 1605 12/24/15 0528  AST 11 17 13*  ALT 10 13* 10*  ALKPHOS 117 98 83  BILITOT 0.70 0.9 0.9  PROT 7.1 7.0 6.3*  ALBUMIN 2.6* 2.7* 2.4*    Recent Labs Lab 12/23/15 1605  LIPASE 15   No results for input(s): AMMONIA in the last 168 hours. Coagulation profile  Recent Labs Lab 12/24/15 0528  INR 1.46    CBC:  Recent Labs Lab 12/18/15 1349 12/23/15 1605 12/24/15 0528  WBC 17.8* 24.6* 21.7*  NEUTROABS 16.2*  --   --   HGB 8.6* 8.1* 7.1*  HCT 25.5* 22.5* 19.6*  MCV 105.8* 99.6 101.6*  PLT 180 245 209  Cardiac Enzymes:  Recent Labs Lab 12/24/15 0048 12/24/15 0528  TROPONINI 0.32* 0.34*   BNP: Invalid input(s): POCBNP CBG: No results for input(s): GLUCAP in the last 168 hours. D-Dimer No results for input(s): DDIMER in the last 72 hours. Hgb A1c No results for input(s): HGBA1C in the last 72 hours. Lipid Profile No results for input(s): CHOL, HDL, LDLCALC, TRIG, CHOLHDL, LDLDIRECT in the last 72 hours. Thyroid function studies  Recent Labs  12/24/15 0528  TSH 0.472   Anemia work up No results for input(s): VITAMINB12, FOLATE, FERRITIN, TIBC, IRON, RETICCTPCT in the last 72 hours. Microbiology Recent Results (from the past 240 hour(s))  Culture, blood (Routine X 2) w Reflex to ID Panel     Status: None (Preliminary result)   Collection Time: 12/23/15 11:38 PM  Result  Value Ref Range Status   Specimen Description   Final    BLOOD RIGHT ARM Performed at [facility]    Special Requests BOTTLES DRAWN AEROBIC AND ANAEROBIC 5CC  Final   Culture PENDING  Incomplete   Report Status PENDING  Incomplete  Culture, blood (Routine X 2) w Reflex to ID Panel     Status: None (Preliminary result)   Collection Time: 12/24/15 12:48 AM  Result Value Ref Range Status   Specimen Description BLOOD LEFT ARM  Final   Special Requests   Final    BOTTLES DRAWN AEROBIC AND ANAEROBIC 10CC Performed at [facility]    Culture PENDING  Incomplete   Report Status PENDING  Incomplete  C difficile quick scan w PCR reflex     Status: None   Collection Time: 12/24/15  2:55 AM  Result Value Ref Range Status   C Diff antigen NEGATIVE NEGATIVE Final   C Diff toxin NEGATIVE NEGATIVE Final   C Diff interpretation Negative for toxigenic C. difficile  Final      Studies:  Ct Abdomen Pelvis W Contrast  12/23/2015  CLINICAL DATA:  Abdominal pain and diarrhea for a few months. 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 EXAM: CT ABDOMEN AND PELVIS WITH CONTRAST TECHNIQUE: Multidetector CT imaging of the abdomen and pelvis was performed using the standard protocol following bolus administration of intravenous contrast. CONTRAST:  38mL ISOVUE-300 IOPAMIDOL (ISOVUE-300) INJECTION 61% COMPARISON:  Outside CT from 11/03/2015.  Outside CT from 08/06/2014 FINDINGS: Lower chest:  Centrilobular emphysema noted in the lung bases. Hepatobiliary: Scattered tiny hypodensities in the liver parenchyma are stable. No enhancing lesion is identified within the liver parenchyma. There is no evidence for gallstones, gallbladder wall thickening, or pericholecystic fluid. Mild intrahepatic biliary duct dilatation has progressed slightly in the interval. Common bile duct in the head of the pancreas measures 8 mm in diameter  today compared to 6 mm on 11/03/2015. Pancreas: Slight prominence of the main pancreatic duct, stable. Spleen: No splenomegaly. No focal mass lesion. Adrenals/Urinary Tract: No adrenal nodule or mass. Atrophic left kidney. No evidence for enhancing renal lesion with evidence of decreased perfusion of the left kidney. No evidence for hydroureter. Bladder is markedly distended. Stomach/Bowel: Stomach is nondistended. No gastric wall thickening. No evidence of outlet obstruction. Duodenum is normally positioned as is the ligament of Treitz. Small bowel loops are nondilated. Terminal ileum unremarkable. The appendix is not visualized, but there is no edema or inflammation in the region of the cecum. Colon is diffusely distended and stool-filled from the cecum to the rectum. Vascular/Lymphatic: There is abdominal aortic  atherosclerosis without aneurysm. Abdominal aorta measures up to 2.9 cm in diameter. No evidence for gastrohepatic or hepatoduodenal ligament lymphadenopathy. No abdominal retroperitoneal lymphadenopathy on the current study. No evidence for pelvic sidewall lymphadenopathy. Reproductive: The prostate gland and seminal vesicles have normal imaging features. Other: A large collection of fluid, debris, and gas is identified in the presacral space, between the rectum and the sacrum. The main component of this complex process measures 13.5 cm in coronal diameter by 9 cm in craniocaudal diameter by 4.5 cm in AP diameter. There is extension of this process posteriorly in the right gluteus musculature, behind the right acetabulum. Gas dissects around to the right side of the rectum, displacing the rectum to the left. There is associated wall thickening in the rectum and perirectal edema/ inflammation. Musculoskeletal: Bone windows reveal no worrisome lytic or sclerotic osseous lesions. IMPRESSION: 1. Large complex collection of gas, debris, and fluid in the presacral space compatible with abscess. This dissects  down around to the right of the rectum and laterally into the right gluteus musculature behind the right acetabulum. No direct communication to the rectal lumen is evident, but features are probably related to rectal perforation in this patient with a history of rectal carcinoma. 2. Markedly distended urinary bladder with the dome of the bladder cranial to the umbilicus. Imaging features are compatible with urinary retention. 3. Atrophic left kidney. 4. Scattered low-density liver lesions stable comparing back to the oldest outside imaging study of 08/06/2014. Imaging findings of potential clinical significance: Emphysema. [id number]) Aortic Atherosclerosis (ICD10-170.0) Electronically Signed   By: Misty Stanley M.D.   On: 12/23/2015 19:36   Dg Abd Acute W/chest  12/23/2015  CLINICAL DATA:  Abdominal pain for a few months. EXAM: DG ABDOMEN ACUTE W/ 1V CHEST COMPARISON:  No comparison studies available. FINDINGS: The lungs are clear wiithout focal pneumonia, edema, pneumothorax or pleural effusion. The cardiopericardial silhouette is within normal limits for size. Left-sided pacer/AICD noted. A right Port-A-Cath is visualized with distal tip position overlying the distal SVC. Old posterior right rib fractures noted. Right-side-up decubitus film shows no evidence for intraperitoneal free air. Supine view the abdomen shows no gaseous bowel dilatation suggest obstruction. Prominent stool volume is seen along the length of the colon. Bones are diffusely demineralized. IMPRESSION: 1. No acute cardiopulmonary findings. 2. No evidence for bowel perforation or obstruction. 3. Large colonic stool volume. Imaging features could be compatible with clinical constipation. Electronically Signed   By: Misty Stanley M.D.   On: 12/23/2015 16:53    Assessment: 70 y.o. with metastatic rectal cancer to lung diagnosed in 07/2014, currently on concurrent radiation and chemotherapy with Xeloda, early-stage prostate cancer diagnosed  in 2060 on Lupron, prior history of epidural abscess status post long course of antibiotics 2 to 3 months ago, cardiomyopathy with cardiac arrest in 2016, status post ICD placement.  1. Large pelvic abscess, prior history of epidural abscess in 08/2015 2. Metastatic rectal cancer to lung, currently on concurrent chemoradiation to rectal primary 3. Early stage prostate cancer, on Lupron  4. 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 Coronary artery disease and Cardiomyopathy with EF 15%, history of cardiac arrest, status post ICD placement 6. Malnutrition 6. Anemia secondary to chemotherapy, iron deficiency, and abscess   Plan:  -He is scheduled to have the percutaneous pelvic abscess drainage by IR this afternoon  -He would need long course of IV antibiotics for the abscess -He was seen by general surgeon Dr. Marcello Moores who recommends loop colostomy to  help the healing of the abcess, but needs cardiac clearance due to his significant cardiac history -will hold chemo xeloda, will update rad/onc Dr. Lisbeth Renshaw also  -Appreciative palliative care seen the patient today, goal of care was discussed with patient's sister, patient and his family wishes to proceed full treatment and remains to be full code for now. -His further anticancer treatment will depend on his healing process of the pelvic abscess, nutritional status and performance status. Chemotherapy has to be held until his abscess resolves, and his overall condition improves  -will defer to rad/onc Dr. Lisbeth Renshaw about his rest rectal radiation, which likely  will be temporarily held also  -Dr. Learta Codding follow up from tomorrow    Truitt Merle, MD 12/24/2015  12:10 PM

## 2015-12-24 NOTE — Procedures (Signed)
CT guided pelvic abscess drain placement No complication No blood loss. See complete dictation in [facility].

## 2015-12-24 NOTE — Progress Notes (Signed)
PROGRESS NOTE  Michael Wade [id number] DOB: Apr 22, 1946 DOA: 12/23/2015 PCP: Pcp Not In System  HPI/Recap of past 24 hours:  Patient is a 70 year old male diagnosed with metastatic rectal cancer a year and a half ago as well as CAD status post V. fib arrest and ICD placement admitted on 7/1 with complaints of steadily worsening back and abdominal pain and in the emergency room patient was found to have a sacral abscess.  Patient admitted to hospitalist service and started on IV antibiotics. Palliative care consulted and after meeting with patient's sister, goals of care plan is for full aggressive interventions at this time. Seen by interventional radiology and underwent CT guided drainage with drain placement. Patient seen byoncology and has been undergoing radiation treatments, missing his most recent dose on 6/30 with 12 cycles left. There also recommending holding his further chemotherapy treatments until he is in better performance condition from this abscess. Seen by general surgery who are recommending possible loop colostomy for diversion, pending cardiac clearance.  Reportedly, patient had very poor ejection fraction of 15% based off of echocardiogram done 13 months ago. Repeat echocardiogram ordered which currently shows normal ejection fraction and only grade 1 diastolic dysfunction.  Patient seen prior to IR drainage. Complains of some pain. In back and abdomen. Denies any breathing complaints. Tired.  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 Surgery recommending diversion colostomy for now, pending cardiac clearance. Continue antibiotics    CAD (coronary artery disease) with history of V. fib arrest and ICD placement: Echocardiogram read at outside  hospital 13 months ago noted markedly depressed EF and echocardiogram today only notes mild diastolic dysfunction. We'll check BNP and also discuss with cardiology as to significantly different echocardiogram results and short period of time and whether any further testing needs to be done.    Encounter for palliative care: Appreciate palliative cares assistance. Sister who is medical power of attorney at this time wants everything done.   Severe Protein calorie malnutrition: Nutrition to see.  Code Status: Full code   Family Communication: Left message for sister   Disposition Plan: Likely will be here for this week as we try to clear patient for surgery    Consultants:  General surgery  Medical oncology  Interventional radiology  Palliative care  Cardiology   Procedures:  Status post CT-guided drainage and drain placement of rectal abscess done 7/2  Echocardiogram done 7/2: Grade 1 diastolic dysfunction, EF 0000000 percent   Antimicrobials:  IV Zosyn 7/1-present  IV vancomycin 7/1-present   DVT prophylaxis:  SCDs   Objective: Filed Vitals:   12/24/15 1229 12/24/15 1234 12/24/15 1239 12/24/15 1300  BP: 101/62 102/59 104/63 120/69  Pulse: 103 100 93 87  Temp:    98.2 F (36.8 C)  TempSrc:    Axillary  Resp: 14 14 14 14   Height:      Weight:      SpO2: 100% 100% 100% 99%    Intake/Output Summary (Last 24 hours) at 12/24/15 1314 Last data filed at 12/24/15 1100  Gross per 24 hour  Intake  437.5 ml  Output   1950 ml  Net -1512.5 ml   Filed Weights   12/23/15 2353  Weight: 52.1 kg (114 lb 13.8 oz)    Exam:   General:  Alert and oriented  2, fatigued, emaciated   Cardiovascular: Regular rhythm, S1-S2   Respiratory: Clear to auscultation bilaterally   Abdomen: Soft, non-distended, nontender, hypoactive bowel sounds   Musculoskeletal: No clubbing or cyanosis or edema   Psychiatry: Seems appropriate, no evidence of psychoses    Data  Reviewed: CBC:  Recent Labs Lab 12/18/15 1349 12/23/15 1605 12/24/15 0528  WBC 17.8* 24.6* 21.7*  NEUTROABS 16.2*  --   --   HGB 8.6* 8.1* 7.1*  HCT 25.5* 22.5* 19.6*  MCV 105.8* 99.6 101.6*  PLT 180 245 [id number]   Basic Metabolic Panel:  Recent Labs Lab 12/18/15 1349 12/23/15 1605 12/24/15 0528  NA 130* 129* 133*  K 3.3* 3.2* 3.2*  CL  --  97* 103  CO2 20* 21* 22  GLUCOSE 112 148* 123*  BUN 13.5 26* 20  CREATININE 0.9 0.99 0.93  CALCIUM 9.4 8.7* 8.4*  MG  --   --  1.5*  PHOS  --   --  2.1*   GFR: Estimated Creatinine Clearance: 56 mL/min (by C-G formula based on Cr of 0.93). Liver Function Tests:  Recent Labs Lab 12/18/15 1349 12/23/15 1605 12/24/15 0528  AST 11 17 13*  ALT 10 13* 10*  ALKPHOS 117 98 83  BILITOT 0.70 0.9 0.9  PROT 7.1 7.0 6.3*  ALBUMIN 2.6* 2.7* 2.4*    Recent Labs Lab 12/23/15 1605  LIPASE 15   No results for input(s): AMMONIA in the last 168 hours. Coagulation Profile:  Recent Labs Lab 12/24/15 0528  INR 1.46   Cardiac Enzymes:  Recent Labs Lab 12/24/15 0048 12/24/15 0528  TROPONINI 0.32* 0.34*   BNP (last 3 results) No results for input(s): PROBNP in the last 8760 hours. HbA1C: No results for input(s): HGBA1C in the last 72 hours. CBG: No results for input(s): GLUCAP in the last 168 hours. Lipid Profile: No results for input(s): CHOL, HDL, LDLCALC, TRIG, CHOLHDL, LDLDIRECT in the last 72 hours. Thyroid Function Tests:  Recent Labs  12/24/15 0528  TSH 0.472   Anemia Panel: No results for input(s): VITAMINB12, FOLATE, FERRITIN, TIBC, IRON, RETICCTPCT in the last 72 hours. Urine analysis:    Component Value Date/Time   COLORURINE YELLOW 12/23/2015 2020   APPEARANCEUR CLEAR 12/23/2015 2020   LABSPEC 1.019 12/23/2015 2020   PHURINE 6.0 12/23/2015 2020   GLUCOSEU NEGATIVE 12/23/2015 2020   HGBUR NEGATIVE 12/23/2015 2020   BILIRUBINUR NEGATIVE 12/23/2015 2020   KETONESUR NEGATIVE 12/23/2015 2020   PROTEINUR  NEGATIVE 12/23/2015 2020   NITRITE NEGATIVE 12/23/2015 2020   LEUKOCYTESUR NEGATIVE 12/23/2015 2020   Sepsis Labs: @LABRCNTIP (procalcitonin:4,lacticidven:4)  ) Recent Results (from the past 240 hour(s))  Culture, blood (Routine X 2) w Reflex to ID Panel     Status: None (Preliminary result)   Collection Time: 12/23/15 11:38 PM  Result Value Ref Range Status   Specimen Description   Final    BLOOD RIGHT ARM Performed at [facility]    Special Requests BOTTLES DRAWN AEROBIC AND ANAEROBIC 5CC  Final   Culture PENDING  Incomplete   Report Status PENDING  Incomplete  Culture, blood (Routine X 2) w Reflex to ID Panel     Status: None (Preliminary result)   Collection Time: 12/24/15 12:48 AM  Result Value Ref Range Status   Specimen Description BLOOD LEFT ARM  Final   Special Requests   Final    BOTTLES DRAWN AEROBIC AND ANAEROBIC 10CC Performed at [facility]    Culture PENDING  Incomplete  Report Status PENDING  Incomplete  C difficile quick scan w PCR reflex     Status: None   Collection Time: 12/24/15  2:55 AM  Result Value Ref Range Status   C Diff antigen NEGATIVE NEGATIVE Final   C Diff toxin NEGATIVE NEGATIVE Final   C Diff interpretation Negative for toxigenic C. difficile  Final      Studies: Ct Abdomen Pelvis W Contrast  12/23/2015  CLINICAL DATA:  Abdominal pain and diarrhea for a few months. 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 EXAM: CT ABDOMEN AND PELVIS WITH CONTRAST TECHNIQUE: Multidetector CT imaging of the abdomen and pelvis was performed using the standard protocol following bolus administration of intravenous contrast. CONTRAST:  78mL ISOVUE-300 IOPAMIDOL (ISOVUE-300) INJECTION 61% COMPARISON:  Outside CT from 11/03/2015.  Outside CT from 08/06/2014 FINDINGS: Lower chest:  Centrilobular emphysema noted in the lung bases. Hepatobiliary: Scattered tiny hypodensities in  the liver parenchyma are stable. No enhancing lesion is identified within the liver parenchyma. There is no evidence for gallstones, gallbladder wall thickening, or pericholecystic fluid. Mild intrahepatic biliary duct dilatation has progressed slightly in the interval. Common bile duct in the head of the pancreas measures 8 mm in diameter today compared to 6 mm on 11/03/2015. Pancreas: Slight prominence of the main pancreatic duct, stable. Spleen: No splenomegaly. No focal mass lesion. Adrenals/Urinary Tract: No adrenal nodule or mass. Atrophic left kidney. No evidence for enhancing renal lesion with evidence of decreased perfusion of the left kidney. No evidence for hydroureter. Bladder is markedly distended. Stomach/Bowel: Stomach is nondistended. No gastric wall thickening. No evidence of outlet obstruction. Duodenum is normally positioned as is the ligament of Treitz. Small bowel loops are nondilated. Terminal ileum unremarkable. The appendix is not visualized, but there is no edema or inflammation in the region of the cecum. Colon is diffusely distended and stool-filled from the cecum to the rectum. Vascular/Lymphatic: There is abdominal aortic atherosclerosis without aneurysm. Abdominal aorta measures up to 2.9 cm in diameter. No evidence for gastrohepatic or hepatoduodenal ligament lymphadenopathy. No abdominal retroperitoneal lymphadenopathy on the current study. No evidence for pelvic sidewall lymphadenopathy. Reproductive: The prostate gland and seminal vesicles have normal imaging features. Other: A large collection of fluid, debris, and gas is identified in the presacral space, between the rectum and the sacrum. The main component of this complex process measures 13.5 cm in coronal diameter by 9 cm in craniocaudal diameter by 4.5 cm in AP diameter. There is extension of this process posteriorly in the right gluteus musculature, behind the right acetabulum. Gas dissects around to the right side of the  rectum, displacing the rectum to the left. There is associated wall thickening in the rectum and perirectal edema/ inflammation. Musculoskeletal: Bone windows reveal no worrisome lytic or sclerotic osseous lesions. IMPRESSION: 1. Large complex collection of gas, debris, and fluid in the presacral space compatible with abscess. This dissects down around to the right of the rectum and laterally into the right gluteus musculature behind the right acetabulum. No direct communication to the rectal lumen is evident, but features are probably related to rectal perforation in this patient with a history of rectal carcinoma. 2. Markedly distended urinary bladder with the dome of the bladder cranial to the umbilicus. Imaging features are compatible with urinary retention. 3. Atrophic left kidney. 4. Scattered low-density liver lesions stable comparing back to the oldest outside imaging study of 08/06/2014. Imaging findings of potential clinical significance: Emphysema. (  [id number]) Aortic Atherosclerosis (ICD10-170.0) Electronically Signed   By: Misty Stanley M.D.   On: 12/23/2015 19:36   Dg Abd Acute W/chest  12/23/2015  CLINICAL DATA:  Abdominal pain for a few months. EXAM: DG ABDOMEN ACUTE W/ 1V CHEST COMPARISON:  No comparison studies available. FINDINGS: The lungs are clear wiithout focal pneumonia, edema, pneumothorax or pleural effusion. The cardiopericardial silhouette is within normal limits for size. Left-sided pacer/AICD noted. A right Port-A-Cath is visualized with distal tip position overlying the distal SVC. Old posterior right rib fractures noted. Right-side-up decubitus film shows no evidence for intraperitoneal free air. Supine view the abdomen shows no gaseous bowel dilatation suggest obstruction. Prominent stool volume is seen along the length of the colon. Bones are diffusely demineralized. IMPRESSION: 1. No acute cardiopulmonary findings. 2. No evidence for bowel perforation or obstruction. 3. Large  colonic stool volume. Imaging features could be compatible with clinical constipation. Electronically Signed   By: Misty Stanley M.D.   On: 12/23/2015 16:53    Scheduled Meds: . carvedilol  6.25 mg Oral Q breakfast  . DULoxetine  30 mg Oral Daily  . fentaNYL      . metoCLOPramide  10 mg Oral TID AC & HS  . midazolam      . ondansetron (ZOFRAN) IV  4 mg Intravenous Once  . piperacillin-tazobactam (ZOSYN)  IV  3.375 g Intravenous Q8H  . sodium chloride flush  3 mL Intravenous Q12H  . tamsulosin  0.4 mg Oral Daily  . vancomycin  1,000 mg Intravenous Q24H    Continuous Infusions:    LOS: 1 day   Time spent: 25 minutes  Annita Brod, MD [facility] Pager [phone]  If 7PM-7AM, please contact night-coverage [email or web address] Password [email or web address] 12/24/2015, 1:14 PM

## 2015-12-24 NOTE — Progress Notes (Signed)
CRITICAL VALUE ALERT  Critical value received:  Troponin 0.32  Date of notification:  12/24/15  Time of notification: [id number]  Critical value read back:Yes.    Nurse who received alert:  Hale Bogus.  MD notified (1st page):  Dr. Roel Cluck  Time of first page:  0145  MD notified (2nd page):  Time of second page:  Responding MD:  Dr. Roel Cluck  Time MD responded: [phone]

## 2015-12-24 NOTE — Progress Notes (Signed)
MD made aware of troponin result. New order given. VWilliams,rn.

## 2015-12-24 NOTE — Progress Notes (Signed)
  Echocardiogram 2D Echocardiogram has been performed.  Tresa Res 12/24/2015, 8:51 AM

## 2015-12-24 NOTE — H&P (Signed)
Chief Complaint: Peri-rectal abscess  Referring Physician(s): Toy Baker  Supervising Physician: Arne Cleveland  Patient Status: Inpatient  History of Present Illness: Michael Wade is a 70 y.o. male who presented with complaint of abdominal pain/back pain and diarrhea that has been persistent for a few months.  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   He has known history of Rectal abscess in the past requiring IV antibiotics.   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  CT scan done yesterday reveals large complex collection of gas, debris, and fluid in the presacral space compatible with abscess. This dissects down around to the right of the rectum and laterally into the right gluteus musculature behind the right acetabulum. No direct communication to the rectal lumen is evident, but features are probably related to rectal perforation in this patient with a history of rectal carcinoma.  We are asked to evaluate him for image guided drainage/drain placement.  He is NPO. He does not take blood thinners.  He has a  Art gallery manager in place.  NOTE: 10/10/2014 he had been ventricular fibrillation arrest and had a ICD placed he has known history of significant coronary artery disease with echogram showing EF of 15% per echo in April 2016. He states that he was not a candidate for operative intervention secondary to cardiac risk.    Past Medical History  Diagnosis Date  . Ventricular  fibrillation ([place])   . CAD (coronary artery disease)   . HTN (hypertension)   . Prostate cancer ([place])   . Rectal cancer ([place])   . Colon cancer [facility])     Past Surgical History  Procedure Laterality Date  . Left knee surgery    . Cardiac defibrillator placement      Allergies: Review of patient's allergies indicates no known allergies.  Medications: Prior to Admission medications   Medication Sig Start Date End Date Taking? Authorizing Provider  capecitabine (XELODA) 500 MG tablet Take 3 tablets (1,500 mg) QAM 2 tabs (1,000 mg) QPM on days of radiation only (Mon-Fri) 11/08/15  Yes Ladell Pier, MD  carvedilol (COREG) 6.25 MG tablet Take 6.25 mg by mouth daily. 07/12/15 07/11/16 Yes Historical Provider, MD  DULoxetine (CYMBALTA) 30 MG capsule Take 1 capsule (30 mg total) by mouth daily. 12/18/15  Yes Owens Shark, NP  ferrous sulfate 325 (65 FE) MG tablet Take 325 mg by mouth daily with breakfast.   Yes Historical Provider, MD  furosemide (LASIX) 40 MG tablet Take 0.5 tablets (20 mg total) by mouth daily. 12/15/15  Yes Hayden Pedro, PA-C  megestrol (MEGACE) 400 MG/10ML suspension Take 5 mLs (200 mg total) by mouth 2 (two) times daily. 12/08/15  Yes Hayden Pedro, PA-C  oxyCODONE-acetaminophen (PERCOCET) 10-325 MG tablet Take 1 tablet by mouth every 4 (four) hours as needed for pain. 12/08/15  Yes Hayden Pedro, PA-C  pantoprazole (PROTONIX) 40 MG tablet Take 1 tablet (40 mg total) by mouth daily. 12/18/15 12/17/16 Yes  Owens Shark, NP  potassium chloride SA (K-DUR,KLOR-CON) 20 MEQ tablet Take 1 tablet (20 mEq total) by mouth daily. 12/19/15  Yes Owens Shark, NP  psyllium (REGULOID) 0.52 g capsule Take 0.52 g by mouth as directed.   Yes Historical Provider, MD  pyridOXINE (B-6) 50 MG tablet Take 50 mg by mouth daily.   Yes Historical Provider, MD  metoCLOPramide (REGLAN) 10 MG tablet Take 1 tablet (10 mg total) by mouth 4 (four) times daily. Patient not taking:  Reported on 12/23/2015 12/19/15   Kyung Rudd, MD     Family History  Problem Relation Age of Onset  . Prostate cancer Father     Social History   Social History  . Marital Status: Single    Spouse Name: N/A  . Number of Children: 0  . Years of Education: N/A   Occupational History  . retired     Personal assistant   Social History Main Topics  . Smoking status: Former Smoker    Types: Cigarettes  . Smokeless tobacco: Never Used     Comment: Smoked from [id number], 1/2 PPD  . Alcohol Use: No  . Drug Use: No  . Sexual Activity: Not Asked   Other Topics Concern  . None   Social History Narrative   Single, no children   Retired from Personal assistant   Lives w/sister and his mom   Ambulates-has walker--FALL RISK    Review of Systems: A 12 point ROS discussed Review of Systems  Constitutional: Positive for activity change, appetite change and fatigue. Negative for fever and chills.  HENT: Negative.   Respiratory: Negative for cough, shortness of breath and wheezing.   Cardiovascular: Negative for chest pain.  Gastrointestinal: Positive for abdominal pain and diarrhea. Negative for nausea and vomiting.  Genitourinary: Negative.   Musculoskeletal: Negative.   Skin: Negative.   Neurological: Negative.   Hematological: Negative.   Psychiatric/Behavioral: Negative.     Vital Signs: BP 108/62 mmHg  Pulse 94  Temp(Src) 97.9 F (36.6 C) (Oral)  Resp 15  Ht 5\' 11"  (1.803 m)  Wt 114 lb 13.8 oz (52.1 kg)  BMI 16.03 kg/m2  SpO2 100%  Physical Exam  Constitutional: He is oriented to person, place, and time.  Thin, frail  HENT:  Head: Normocephalic and atraumatic.  Eyes: EOM are normal.  Neck: Normal range of motion. Neck supple.  Cardiovascular: Normal rate, regular rhythm and normal heart sounds.   ICD implanted left chest. Port A Cath right chest  Pulmonary/Chest: Effort normal and breath sounds normal. No respiratory distress.  Abdominal: Soft. Bowel sounds are normal.    Neurological: He is alert and oriented to person, place, and time.  Skin: Skin is warm and dry.  Psychiatric: He has a normal mood and affect. His behavior is normal. Judgment and thought content normal.  Vitals reviewed.   Mallampati Score:  MD Evaluation Airway: WNL Heart: Other (comments) Heart  comments: ICD implanted Abdomen: WNL Chest/ Lungs: WNL ASA  Classification: 3 Mallampati/Airway Score: Two  Imaging: Ct Abdomen Pelvis W Contrast  12/23/2015  CLINICAL DATA:  Abdominal pain and diarrhea for a few months. 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 EXAM: CT ABDOMEN AND PELVIS WITH CONTRAST TECHNIQUE: Multidetector CT imaging of the abdomen and pelvis was performed using the standard protocol following bolus administration of intravenous contrast. CONTRAST:  12mL ISOVUE-300 IOPAMIDOL (ISOVUE-300) INJECTION 61% COMPARISON:  Outside CT from 11/03/2015.  Outside CT from 08/06/2014 FINDINGS: Lower chest:  Centrilobular emphysema noted in the lung bases. Hepatobiliary: Scattered tiny hypodensities in the liver parenchyma are stable. No enhancing lesion is identified within the liver parenchyma. There is no evidence for gallstones, gallbladder wall thickening, or pericholecystic fluid. Mild intrahepatic biliary duct dilatation has progressed slightly in the interval. Common bile duct in the head of the pancreas measures 8 mm in diameter today compared to 6 mm on 11/03/2015. Pancreas: Slight prominence of the main pancreatic duct, stable. Spleen: No splenomegaly. No focal mass lesion. Adrenals/Urinary Tract: No adrenal nodule or mass. Atrophic left kidney. No evidence for enhancing renal lesion with evidence of decreased perfusion of the left kidney. No evidence for hydroureter. Bladder is markedly distended. Stomach/Bowel: Stomach is nondistended. No gastric wall thickening. No evidence of outlet obstruction. Duodenum  is normally positioned as is the ligament of Treitz. Small bowel loops are nondilated. Terminal ileum unremarkable. The appendix is not visualized, but there is no edema or inflammation in the region of the cecum. Colon is diffusely distended and stool-filled from the cecum to the rectum. Vascular/Lymphatic: There is abdominal aortic atherosclerosis without aneurysm. Abdominal aorta measures up to 2.9 cm in diameter. No evidence for gastrohepatic or hepatoduodenal ligament lymphadenopathy. No abdominal retroperitoneal lymphadenopathy on the current study. No evidence for pelvic sidewall lymphadenopathy. Reproductive: The prostate gland and seminal vesicles have normal imaging features. Other: A large collection of fluid, debris, and gas is identified in the presacral space, between the rectum and the sacrum. The main component of this complex process measures 13.5 cm in coronal diameter by 9 cm in craniocaudal diameter by 4.5 cm in AP diameter. There is extension of this process posteriorly in the right gluteus musculature, behind the right acetabulum. Gas dissects around to the right side of the rectum, displacing the rectum to the left. There is associated wall thickening in the rectum and perirectal edema/ inflammation. Musculoskeletal: Bone windows reveal no worrisome lytic or sclerotic osseous lesions. IMPRESSION: 1. Large complex collection of gas, debris, and fluid in the presacral space compatible with abscess. This dissects down around to the right of the rectum and laterally into the right gluteus musculature behind the right acetabulum. No direct communication to the rectal lumen is evident, but features are probably related to rectal perforation in this patient with a history of rectal carcinoma. 2. Markedly distended urinary bladder with the dome of the bladder cranial to the umbilicus. Imaging features are compatible with urinary retention. 3. Atrophic left kidney. 4. Scattered low-density liver  lesions stable comparing back to the oldest outside imaging study of 08/06/2014. Imaging findings of potential clinical significance: Emphysema. [id number]) Aortic Atherosclerosis (ICD10-170.0) Electronically Signed   By: Misty Stanley M.D.   On: 12/23/2015 19:36   Dg Abd Acute W/chest  12/23/2015  CLINICAL DATA:  Abdominal pain for a few months. EXAM: DG ABDOMEN ACUTE W/ 1V CHEST COMPARISON:  No comparison studies available. FINDINGS: The lungs are clear wiithout focal pneumonia, edema, pneumothorax or pleural effusion. The cardiopericardial silhouette is within normal limits for size. Left-sided pacer/AICD noted. A right Port-A-Cath is visualized with distal tip position overlying the distal SVC. Old posterior right rib fractures noted. Right-side-up decubitus film shows no evidence for intraperitoneal free air. Supine view the abdomen shows no gaseous bowel dilatation suggest obstruction. Prominent stool volume is seen along the length of the colon. Bones are diffusely demineralized. IMPRESSION: 1. No acute cardiopulmonary findings. 2. No evidence for bowel perforation or obstruction. 3. Large colonic  stool volume. Imaging features could be compatible with clinical constipation. Electronically Signed   By: Misty Stanley M.D.   On: 12/23/2015 16:53    Labs:  CBC:  Recent Labs  12/05/15 1025 12/18/15 1349 12/23/15 1605 12/24/15 0528  WBC 8.1 17.8* 24.6* 21.7*  HGB 9.4* 8.6* 8.1* 7.1*  HCT 27.9* 25.5* 22.5* 19.6*  PLT 297 180 245 209    COAGS:  Recent Labs  12/24/15 0528  INR 1.46    BMP:  Recent Labs  10/24/15 1535 12/18/15 1349 12/23/15 1605 12/24/15 0528  NA 139 130* 129* 133*  K 4.3 3.3* 3.2* 3.2*  CL  --   --  97* 103  CO2 24 20* 21* 22  GLUCOSE 89 112 148* 123*  BUN 25.3 13.5 26* 20  CALCIUM 10.0 9.4 8.7* 8.4*  CREATININE 1.1 0.9 0.99 0.93  GFRNONAA  --   --  >60 >60  GFRAA  --   --  >60 >60    LIVER FUNCTION TESTS:  Recent Labs  10/24/15 1535  12/18/15 1349 12/23/15 1605 12/24/15 0528  BILITOT <0.30 0.70 0.9 0.9  AST 23 11 17  13*  ALT 14 10 13* 10*  ALKPHOS 156* 117 98 83  PROT 8.1 7.1 7.0 6.3*  ALBUMIN 3.3* 2.6* 2.7* 2.4*    TUMOR MARKERS: No results for input(s): AFPTM, CEA, CA199, CHROMGRNA in the last 8760 hours.  Assessment and Plan: Large complex collection of gas, debris, and fluid in the presacral space compatible with abscess.   CT scan reviewed by Dr. Vernard Gambles.   Will proceed with CT guided drainage/drain placement today.  Risks and Benefits discussed with the patient including bleeding, infection, damage to adjacent structures, bowel perforation/fistula connection, and sepsis.  All of the patient's questions were answered, patient is agreeable to proceed. Consent signed and in chart.  Thank you for this interesting consult.  I greatly enjoyed meeting Michael Wade and look forward to participating in their care.  A copy of this report was sent to the requesting provider on this date.  Electronically Signed: Murrell Redden PA-C 12/24/2015, 9:23 AM   I spent a total of 40 Minutes in face to face in clinical consultation, greater than 50% of which was counseling/coordinating care for CT guided drain.

## 2015-12-24 NOTE — Progress Notes (Signed)
Alerted MD of second + Troponin at 0.34, at this time.

## 2015-12-24 NOTE — Consult Note (Signed)
Reason for Consult: pelvic abscess, rectal cancer Referring Physician: Dr Carrolyn Leigh is an 70 y.o. male.  HPI: 70 y.o. M with significant cardiac co morbidities who presents to the hospital with worsening abd pain.  He has been treated with chemotherapy and recent completion radiation for rectal cancer with metastatic disease.  Pt reports a long h/o diarrhea and abd pain (last 9 months).  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  His EF is ~15%  Past Medical History  Diagnosis Date  . Ventricular fibrillation ([place])   . CAD (coronary artery disease)   . HTN (hypertension)   . Prostate cancer ([place])   . Rectal cancer ([place])   . Colon cancer [facility])     Past Surgical History  Procedure Laterality Date  . Left knee surgery    . Cardiac defibrillator placement      Family History  Problem Relation Age of Onset  . Prostate cancer Father     Social History:  reports that he has quit smoking. His smoking use included Cigarettes. He has never used smokeless tobacco. He reports that he does not drink alcohol or use illicit drugs.  Allergies: No Known Allergies  Medications: I have reviewed the patient's current medications.  Results for orders placed or performed during the hospital encounter of 12/23/15 (from the past 48 hour(s))  Lipase, blood     Status: None   Collection Time: 12/23/15  4:05 PM  Result Value Ref Range   Lipase 15 11 - 51 U/L  Comprehensive metabolic panel     Status: Abnormal   Collection Time: 12/23/15  4:05 PM  Result Value Ref Range   Sodium 129 (L) 135 - 145 mmol/L   Potassium 3.2 (L) 3.5 - 5.1 mmol/L   Chloride 97 (L) 101 - 111 mmol/L   CO2 21 (L) 22 - 32 mmol/L   Glucose, Bld 148 (H) 65 - 99 mg/dL   BUN 26 (H) 6 - 20 mg/dL   Creatinine, Ser 0.99 0.61 - 1.24 mg/dL   Calcium 8.7 (L) 8.9 - 10.3 mg/dL   Total Protein 7.0 6.5 - 8.1 g/dL   Albumin 2.7 (L) 3.5 - 5.0 g/dL   AST 17 15 - 41 U/L   ALT 13 (L) 17 - 63 U/L    Alkaline Phosphatase 98 38 - 126 U/L   Total Bilirubin 0.9 0.3 - 1.2 mg/dL   GFR calc non Af Amer >60 >60 mL/min   GFR calc Af Amer >60 >60 mL/min    Comment: (NOTE) The eGFR has been calculated using the CKD EPI equation. This calculation has not been validated in all clinical situations. eGFR's persistently <60 mL/min signify possible Chronic Kidney Disease.    Anion gap 11 5 - 15  CBC     Status: Abnormal   Collection Time: 12/23/15  4:05 PM  Result Value Ref Range   WBC 24.6 (H) 4.0 - 10.5 K/uL   RBC 2.26 (L) 4.22 - 5.81 MIL/uL   Hemoglobin 8.1 (L) 13.0 - 17.0 g/dL   HCT 22.5 (L) 39.0 - 52.0 %   MCV 99.6 78.0 - 100.0 fL   MCH 35.8 (H) 26.0 - 34.0 pg   MCHC 36.0 30.0 - 36.0 g/dL   RDW 19.6 (H) 11.5 - 15.5 %   Platelets 245 150 - 400 K/uL  POC occult blood, ED     Status: Abnormal   Collection Time: 12/23/15  5:17 PM  Result Value Ref Range   Fecal Occult Bld POSITIVE (A) NEGATIVE  Urinalysis, Routine w reflex microscopic     Status: None   Collection Time: 12/23/15  8:20 PM  Result Value Ref Range   Color, Urine YELLOW YELLOW   APPearance CLEAR CLEAR   Specific Gravity, Urine 1.019 1.005 - 1.030   pH 6.0 5.0 - 8.0   Glucose, UA NEGATIVE NEGATIVE mg/dL   Hgb urine dipstick NEGATIVE NEGATIVE   Bilirubin Urine NEGATIVE NEGATIVE   Ketones, ur NEGATIVE NEGATIVE mg/dL   Protein, ur NEGATIVE NEGATIVE mg/dL   Nitrite NEGATIVE NEGATIVE   Leukocytes, UA NEGATIVE NEGATIVE    Comment: MICROSCOPIC NOT DONE ON URINES WITH NEGATIVE PROTEIN, BLOOD, LEUKOCYTES, NITRITE, OR GLUCOSE <1000 mg/dL.  Troponin I     Status: Abnormal   Collection Time: 12/24/15 12:48 AM  Result Value Ref Range   Troponin I 0.32 (HH) <0.03 ng/mL    Comment: CRITICAL RESULT CALLED TO, READ BACK BY AND VERIFIED WITH: [facility] RN [phone] [id number] [place],N   C difficile quick scan w PCR reflex     Status: None   Collection Time: 12/24/15  2:55 AM  Result Value Ref Range   C Diff antigen NEGATIVE NEGATIVE    C Diff toxin NEGATIVE NEGATIVE   C Diff interpretation Negative for toxigenic C. difficile   Magnesium     Status: Abnormal   Collection Time: 12/24/15  5:28 AM  Result Value Ref Range   Magnesium 1.5 (L) 1.7 - 2.4 mg/dL  Phosphorus     Status: Abnormal   Collection Time: 12/24/15  5:28 AM  Result Value Ref Range   Phosphorus 2.1 (L) 2.5 - 4.6 mg/dL  TSH     Status: None   Collection Time: 12/24/15  5:28 AM  Result Value Ref Range   TSH 0.472 0.350 - 4.500 uIU/mL  Comprehensive metabolic panel     Status: Abnormal   Collection Time: 12/24/15  5:28 AM  Result Value Ref Range   Sodium 133 (L) 135 - 145 mmol/L   Potassium 3.2 (L) 3.5 - 5.1 mmol/L   Chloride 103 101 - 111 mmol/L   CO2 22 22 - 32 mmol/L   Glucose, Bld 123 (H) 65 - 99 mg/dL   BUN 20 6 - 20 mg/dL   Creatinine, Ser 0.93 0.61 - 1.24 mg/dL   Calcium 8.4 (L) 8.9 - 10.3 mg/dL   Total Protein 6.3 (L) 6.5 - 8.1 g/dL   Albumin 2.4 (L) 3.5 - 5.0 g/dL   AST 13 (L) 15 - 41 U/L   ALT 10 (L) 17 - 63 U/L   Alkaline Phosphatase 83 38 - 126 U/L   Total Bilirubin 0.9 0.3 - 1.2 mg/dL   GFR calc non Af Amer >60 >60 mL/min   GFR calc Af Amer >60 >60 mL/min    Comment: (NOTE) The eGFR has been calculated using the CKD EPI equation. This calculation has not been validated in all clinical situations. eGFR's persistently <60 mL/min signify possible Chronic Kidney Disease.    Anion gap 8 5 - 15  CBC     Status: Abnormal   Collection Time: 12/24/15  5:28 AM  Result Value Ref Range   WBC 21.7 (H) 4.0 - 10.5 K/uL   RBC 1.93 (L) 4.22 - 5.81 MIL/uL   Hemoglobin 7.1 (L) 13.0 - 17.0 g/dL   HCT 19.6 (L) 39.0 - 52.0 %   MCV 101.6 (H) 78.0 - 100.0 fL   MCH 36.8 (H)  26.0 - 34.0 pg   MCHC 36.2 (H) 30.0 - 36.0 g/dL   RDW 19.8 (H) 11.5 - 15.5 %   Platelets 209 150 - 400 K/uL  Protime-INR     Status: Abnormal   Collection Time: 12/24/15  5:28 AM  Result Value Ref Range   Prothrombin Time 17.3 (H) 11.6 - 15.2 seconds   INR 1.46 0.00 - 1.49   Type and screen [place]     Status: None   Collection Time: 12/24/15  5:28 AM  Result Value Ref Range   ABO/RH(D) O POS    Antibody Screen NEG    Sample Expiration 12/27/2015   Troponin I (q 6hr x 3)     Status: Abnormal   Collection Time: 12/24/15  5:28 AM  Result Value Ref Range   Troponin I 0.34 (HH) <0.03 ng/mL    Comment: CRITICAL VALUE NOTED.  VALUE IS CONSISTENT WITH PREVIOUSLY REPORTED AND CALLED VALUE.    Ct Abdomen Pelvis W Contrast  12/23/2015  CLINICAL DATA:  Abdominal pain and diarrhea for a few months. 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 EXAM: CT ABDOMEN AND PELVIS WITH CONTRAST TECHNIQUE: Multidetector CT imaging of the abdomen and pelvis was performed using the standard protocol following bolus administration of intravenous contrast. CONTRAST:  102m ISOVUE-300 IOPAMIDOL (ISOVUE-300) INJECTION 61% COMPARISON:  Outside CT from 11/03/2015.  Outside CT from 08/06/2014 FINDINGS: Lower chest:  Centrilobular emphysema noted in the lung bases. Hepatobiliary: Scattered tiny hypodensities in the liver parenchyma are stable. No enhancing lesion is identified within the liver parenchyma. There is no evidence for gallstones, gallbladder wall thickening, or pericholecystic fluid. Mild intrahepatic biliary duct dilatation has progressed slightly in the interval. Common bile duct in the head of the pancreas measures 8 mm in diameter today compared to 6 mm on 11/03/2015. Pancreas: Slight prominence of the main pancreatic duct, stable. Spleen: No splenomegaly. No focal mass lesion. Adrenals/Urinary Tract: No adrenal nodule or mass. Atrophic left kidney. No evidence for enhancing renal lesion with evidence of decreased perfusion of the left kidney. No evidence for hydroureter. Bladder is markedly distended. Stomach/Bowel: Stomach is nondistended. No gastric wall thickening. No evidence of outlet  obstruction. Duodenum is normally positioned as is the ligament of Treitz. Small bowel loops are nondilated. Terminal ileum unremarkable. The appendix is not visualized, but there is no edema or inflammation in the region of the cecum. Colon is diffusely distended and stool-filled from the cecum to the rectum. Vascular/Lymphatic: There is abdominal aortic atherosclerosis without aneurysm. Abdominal aorta measures up to 2.9 cm in diameter. No evidence for gastrohepatic or hepatoduodenal ligament lymphadenopathy. No abdominal retroperitoneal lymphadenopathy on the current study. No evidence for pelvic sidewall lymphadenopathy. Reproductive: The prostate gland and seminal vesicles have normal imaging features. Other: A large collection of fluid, debris, and gas is identified in the presacral space, between the rectum and the sacrum. The main component of this complex process measures 13.5 cm in coronal diameter by 9 cm in craniocaudal diameter by 4.5 cm in AP diameter. There is extension of this process posteriorly in the right gluteus musculature, behind the right acetabulum. Gas dissects around to the right side of the rectum, displacing the rectum to the left. There is associated wall thickening in the rectum and perirectal edema/ inflammation. Musculoskeletal: Bone windows reveal no worrisome lytic or sclerotic osseous lesions. IMPRESSION: 1. Large complex collection of gas, debris, and fluid in the presacral  space compatible with abscess. This dissects down around to the right of the rectum and laterally into the right gluteus musculature behind the right acetabulum. No direct communication to the rectal lumen is evident, but features are probably related to rectal perforation in this patient with a history of rectal carcinoma. 2. Markedly distended urinary bladder with the dome of the bladder cranial to the umbilicus. Imaging features are compatible with urinary retention. 3. Atrophic left kidney. 4. Scattered  low-density liver lesions stable comparing back to the oldest outside imaging study of 08/06/2014. Imaging findings of potential clinical significance: Emphysema. (YHC62-B76.9) Aortic Atherosclerosis (ICD10-170.0) Electronically Signed   By: Misty Stanley M.D.   On: 12/23/2015 19:36   Dg Abd Acute W/chest  12/23/2015  CLINICAL DATA:  Abdominal pain for a few months. EXAM: DG ABDOMEN ACUTE W/ 1V CHEST COMPARISON:  No comparison studies available. FINDINGS: The lungs are clear wiithout focal pneumonia, edema, pneumothorax or pleural effusion. The cardiopericardial silhouette is within normal limits for size. Left-sided pacer/AICD noted. A right Port-A-Cath is visualized with distal tip position overlying the distal SVC. Old posterior right rib fractures noted. Right-side-up decubitus film shows no evidence for intraperitoneal free air. Supine view the abdomen shows no gaseous bowel dilatation suggest obstruction. Prominent stool volume is seen along the length of the colon. Bones are diffusely demineralized. IMPRESSION: 1. No acute cardiopulmonary findings. 2. No evidence for bowel perforation or obstruction. 3. Large colonic stool volume. Imaging features could be compatible with clinical constipation. Electronically Signed   By: Misty Stanley M.D.   On: 12/23/2015 16:53    Review of Systems  Constitutional: Negative for fever and chills.  HENT: Negative for hearing loss.   Eyes: Negative for blurred vision and double vision.  Respiratory: Negative for cough, sputum production and shortness of breath.   Cardiovascular: Negative for chest pain and palpitations.  Gastrointestinal: Positive for abdominal pain and diarrhea. Negative for nausea and vomiting.  Genitourinary: Negative for dysuria, urgency and frequency.  Musculoskeletal: Positive for joint pain.  Skin: Negative for itching and rash.  Neurological: Negative for dizziness and headaches.   Blood pressure 108/62, pulse 94, temperature 97.9 F  (36.6 C), temperature source Oral, resp. rate 15, height _0  (1.803 m), weight 52.1 kg (114 lb 13.8 oz), SpO2 100 %. Physical Exam  Constitutional: He is oriented to person, place, and time. No distress.  HENT:  Head: Normocephalic and atraumatic.  Eyes: Conjunctivae and EOM are normal. Pupils are equal, round, and reactive to light.  Neck: Normal range of motion. Neck supple.  Cardiovascular: Normal rate and regular rhythm.   Respiratory: Effort normal and breath sounds normal. No respiratory distress.  GI: Soft. He exhibits no distension. There is tenderness. There is no rebound and no guarding.  Mild tenderness to palpation in lower abdomen  Musculoskeletal: Normal range of motion.  Neurological: He is alert and oriented to person, place, and time.  Skin: Skin is warm and dry. He is not diaphoretic.    Assessment/Plan: 70 y.o. M with significant cardiac disease and large pelvic abscess.  Ideally he needs drainage of abscess and diversion of stool.  I have discussed his case with the radiologist, and they are going to place a trans-gluteal drain today.  The next step would be to evaluate him for possible loop colostomy in the OR for diversion.  We will probably need cardiology to evaluate his risk prior to making this decision though.  If he cannot undergo a diversion, I think  he has little to no chance of healing this abscess and returning to any meaningful cancer treatment.    Peace Jost C. 01/23/1571, 6:20 AM

## 2015-12-24 NOTE — Progress Notes (Signed)
Pt returned from IR with left buttock drain intact and dry. Draining "tan" fluid. Pt drowsy and in bed.  VS WNL

## 2015-12-25 ENCOUNTER — Other Ambulatory Visit: Payer: 59

## 2015-12-25 ENCOUNTER — Ambulatory Visit: Payer: Medicare (Managed Care)

## 2015-12-25 ENCOUNTER — Ambulatory Visit
Admit: 2015-12-25 | Discharge: 2015-12-25 | Disposition: A | Discharge status: Home / Self Care | Payer: Medicare (Managed Care) | Attending: Radiation Oncology | Admitting: Radiation Oncology

## 2015-12-25 ENCOUNTER — Encounter (HOSPITAL_COMMUNITY): Payer: Self-pay | Admitting: Physician Assistant

## 2015-12-25 DIAGNOSIS — R636 Underweight: Secondary | ICD-10-CM | POA: Diagnosis present

## 2015-12-25 DIAGNOSIS — Z01810 Encounter for preprocedural cardiovascular examination: Secondary | ICD-10-CM

## 2015-12-25 DIAGNOSIS — E43 Unspecified severe protein-calorie malnutrition: Secondary | ICD-10-CM | POA: Diagnosis present

## 2015-12-25 LAB — CBC
HEMATOCRIT: 16 % — AB (ref 39.0–52.0)
Hemoglobin: 5.8 g/dL — CL (ref 13.0–17.0)
MCH: 36.7 pg — AB (ref 26.0–34.0)
MCHC: 36.3 g/dL — AB (ref 30.0–36.0)
MCV: 101.3 fL — AB (ref 78.0–100.0)
Platelets: 187 10*3/uL (ref 150–400)
RBC: 1.58 MIL/uL — ABNORMAL LOW (ref 4.22–5.81)
RDW: 20 % — AB (ref 11.5–15.5)
WBC: 11.1 10*3/uL — ABNORMAL HIGH (ref 4.0–10.5)

## 2015-12-25 LAB — IRON AND TIBC
Iron: 94 ug/dL (ref 45–182)
SATURATION RATIOS: 49 % — AB (ref 17.9–39.5)
TIBC: 193 ug/dL — AB (ref 250–450)
UIBC: 99 ug/dL

## 2015-12-25 LAB — RETICULOCYTES
RBC.: 2.84 MIL/uL — AB (ref 4.22–5.81)
RETIC CT PCT: 3 % (ref 0.4–3.1)
Retic Count, Absolute: 85.2 10*3/uL (ref 19.0–186.0)

## 2015-12-25 LAB — COMPREHENSIVE METABOLIC PANEL
ALT: 9 U/L — ABNORMAL LOW (ref 17–63)
ANION GAP: 7 (ref 5–15)
AST: 12 U/L — ABNORMAL LOW (ref 15–41)
Albumin: 2 g/dL — ABNORMAL LOW (ref 3.5–5.0)
Alkaline Phosphatase: 63 U/L (ref 38–126)
BILIRUBIN TOTAL: 0.9 mg/dL (ref 0.3–1.2)
BUN: 11 mg/dL (ref 6–20)
CO2: 22 mmol/L (ref 22–32)
Calcium: 7.7 mg/dL — ABNORMAL LOW (ref 8.9–10.3)
Chloride: 102 mmol/L (ref 101–111)
Creatinine, Ser: 0.81 mg/dL (ref 0.61–1.24)
GFR calc Af Amer: >60 mL/min (ref >60)
Glucose, Bld: 111 mg/dL — ABNORMAL HIGH (ref 65–99)
POTASSIUM: 2.6 mmol/L — AB (ref 3.5–5.1)
Sodium: 131 mmol/L — ABNORMAL LOW (ref 135–145)
TOTAL PROTEIN: 5.2 g/dL — AB (ref 6.5–8.1)

## 2015-12-25 LAB — PREPARE RBC (CROSSMATCH)

## 2015-12-25 LAB — VITAMIN B12: VITAMIN B 12: 873 pg/mL (ref 180–914)

## 2015-12-25 LAB — FOLATE: FOLATE: 11.6 ng/mL (ref >5.9)

## 2015-12-25 LAB — FERRITIN: Ferritin: 809 ng/mL — ABNORMAL HIGH (ref 24–336)

## 2015-12-25 MED ORDER — ENSURE ENLIVE PO LIQD
237.0000 mL | Freq: Two times a day (BID) | ORAL | Status: DC
  Administered 2015-12-25 – 2015-12-28 (×5): 237 mL via ORAL

## 2015-12-25 MED ORDER — POTASSIUM CHLORIDE 10 MEQ/100ML IV SOLN
10.0000 meq | INTRAVENOUS | Status: AC
  Administered 2015-12-25 (×6): 10 meq via INTRAVENOUS
  Filled 2015-12-25 (×6): qty 100

## 2015-12-25 MED ORDER — MAGNESIUM SULFATE 2 GM/50ML IV SOLN
2.0000 g | Freq: Once | INTRAVENOUS | Status: AC
  Administered 2015-12-25: 2 g via INTRAVENOUS
  Filled 2015-12-25: qty 50

## 2015-12-25 MED ORDER — FUROSEMIDE 10 MG/ML IJ SOLN: 20.0000 mg | Freq: Once | INTRAMUSCULAR | Status: AC

## 2015-12-25 MED ORDER — SODIUM CHLORIDE 0.9 % IV SOLN: Freq: Once | INTRAVENOUS | Status: DC

## 2015-12-25 MED ORDER — SODIUM CHLORIDE 0.9 % IV SOLN
Freq: Once | INTRAVENOUS | Status: AC
  Administered 2015-12-25: 10:00:00 via INTRAVENOUS

## 2015-12-25 MED ORDER — FUROSEMIDE 10 MG/ML IJ SOLN
20.0000 mg | Freq: Once | INTRAMUSCULAR | Status: AC
  Administered 2015-12-25: 20 mg via INTRAVENOUS
  Filled 2015-12-25: qty 2

## 2015-12-25 MED ORDER — VANCOMYCIN HCL IN DEXTROSE 750-5 MG/150ML-% IV SOLN
750.0000 mg | Freq: Two times a day (BID) | INTRAVENOUS | Status: DC
  Administered 2015-12-25 – 2015-12-28 (×7): 750 mg via INTRAVENOUS
  Filled 2015-12-25 (×8): qty 150

## 2015-12-25 NOTE — Consult Note (Signed)
CARDIOLOGY CONSULT NOTE   Patient ID: Michael Wade MRN: [id number] DOB/AGE: 02/02/1946 70 y.o.  Admit date: 12/23/2015  Primary Physician   Pcp Not In System Primary Cardiologist   Dr Caryl Wade, Dr Benjamine Sprague in [place], [place] Reason for Consultation   Preop eval Requesting MD: Dr Lyman Speller  [id number] Michael Wade is a 71 y.o. year old male with a history of NICM & ICM, s/p VF arrest 10/19/2014>>EF 15% echo, cath w/ CTO RCA, +collaterals. Hx rectal cancer (w/ mets to lung), colon cancer, prostate cancer, HTN.   Admitted 12/23/2015 w/ abdominal & back pain, diarrhea. Found to have a rectal abscess, abnormal electrolytes and worsened anemia, being treated. There is a possibility that a surgical procedure will help, diverting ileostomy is mentioned. Cards asked to eval preop. Echo w/ EF now 55-60%.  Michael Wade feels generally bad. He is very weak, has lost about 40 lbs. He hurts, can only lay on 1 side and even that is not comfortable. He is cooperative, alert. He does not feel any better than he did when he got here.    He has not had any anginal chest pain recently. His breathing has been good. He has not had LE edema, orthopnea or PND. He has not had palpitations, his ICD has not fired. His activity level is poor because of his illness. His PO intake is also poor. He describes GI issues of heartburn after eating, belching issues, water brash. These issues have not been well-controlled, meds like Gas-X have not helped.    Past Medical History  Diagnosis Date  . Ventricular fibrillation ([place]) 09/2014  . CAD (coronary artery disease) 09/2014    CTO RCA  . HTN (hypertension)   . Prostate cancer ([place])   . Rectal cancer ([place])   . Colon cancer [facility])      Past Surgical History  Procedure Laterality Date  . Knee arthroscopy Left   . Cardiac defibrillator placement  10/25/2014    [employer]  . Cardiac catheterization  10/20/2014    CTO RCA w/ collat, single vessel dz, med rx    No Known  Allergies  I have reviewed the patient's current medications . sodium chloride   Intravenous Once  . carvedilol  6.25 mg Oral Q breakfast  . DULoxetine  30 mg Oral Daily  . feeding supplement (ENSURE ENLIVE)  237 mL Oral BID BM  . furosemide  20 mg Intravenous Once  . furosemide  20 mg Intravenous Once  . metoCLOPramide  10 mg Oral TID AC & HS  . ondansetron (ZOFRAN) IV  4 mg Intravenous Once  . piperacillin-tazobactam (ZOSYN)  IV  3.375 g Intravenous Q8H  . potassium chloride  10 mEq Intravenous Q1 Hr x 6  . sodium chloride flush  3 mL Intravenous Q12H  . tamsulosin  0.4 mg Oral Daily  . vancomycin  750 mg Intravenous Q12H     acetaminophen **OR** acetaminophen, HYDROmorphone (DILAUDID) injection, ondansetron **OR** ondansetron (ZOFRAN) IV, oxyCODONE-acetaminophen **AND** oxyCODONE, sodium chloride flush  Prior to Admission medications   Medication Sig Start Date End Date Taking? Authorizing Provider  capecitabine (XELODA) 500 MG tablet Take 3 tablets (1,500 mg) QAM 2 tabs (1,000 mg) QPM on days of radiation only (Mon-Fri) 11/08/15  Yes Ladell Pier, MD  carvedilol (COREG) 6.25 MG tablet Take 6.25 mg by mouth daily. 07/12/15 07/11/16 Yes Historical Provider, MD  DULoxetine (CYMBALTA) 30 MG capsule Take 1 capsule (30 mg total) by mouth daily. 12/18/15  Yes Lattie Haw  Verlene Mayer, NP  ferrous sulfate 325 (65 FE) MG tablet Take 325 mg by mouth daily with breakfast.   Yes Historical Provider, MD  furosemide (LASIX) 40 MG tablet Take 0.5 tablets (20 mg total) by mouth daily. 12/15/15  Yes Hayden Pedro, PA-C  megestrol (MEGACE) 400 MG/10ML suspension Take 5 mLs (200 mg total) by mouth 2 (two) times daily. 12/08/15  Yes Hayden Pedro, PA-C  oxyCODONE-acetaminophen (PERCOCET) 10-325 MG tablet Take 1 tablet by mouth every 4 (four) hours as needed for pain. 12/08/15  Yes Hayden Pedro, PA-C  pantoprazole (PROTONIX) 40 MG tablet Take 1 tablet (40 mg total) by mouth daily. 12/18/15  12/17/16 Yes Owens Shark, NP  potassium chloride SA (K-DUR,KLOR-CON) 20 MEQ tablet Take 1 tablet (20 mEq total) by mouth daily. 12/19/15  Yes Owens Shark, NP  psyllium (REGULOID) 0.52 g capsule Take 0.52 g by mouth as directed.   Yes Historical Provider, MD  pyridOXINE (B-6) 50 MG tablet Take 50 mg by mouth daily.   Yes Historical Provider, MD  metoCLOPramide (REGLAN) 10 MG tablet Take 1 tablet (10 mg total) by mouth 4 (four) times daily. Patient not taking: Reported on 12/23/2015 12/19/15   Kyung Rudd, MD     Social History   Social History  . Marital Status: Single    Spouse Name: N/A  . Number of Children: 0  . Years of Education: N/A   Occupational History  . retired     Personal assistant   Social History Main Topics  . Smoking status: Former Smoker    Types: Cigarettes  . Smokeless tobacco: Never Used     Comment: Smoked from [id number], 1/2 PPD  . Alcohol Use: No  . Drug Use: No  . Sexual Activity: Not on file   Other Topics Concern  . Not on file   Social History Narrative   Single, no children   Retired from Personal assistant   Lives w/sister and his mom   Ambulates-has walker--FALL RISK    Family Status  Relation Status Death Age  . Mother Alive   . Sister Alive    Family History  Problem Relation Age of Onset  . Prostate cancer Father      ROS:  Full 14 point review of systems complete and found to be negative unless listed above.  Physical Exam: Blood pressure 99/60, pulse 71, temperature 98.7 F (37.1 C), temperature source Oral, resp. rate 18, height 5\' 11"  (1.803 m), weight 114 lb 13.8 oz (52.1 kg), SpO2 100 %.  General: Well developed, thin and frail, male in no acute distress Head: Eyes PERRLA, No xanthomas.   Normocephalic and atraumatic, oropharynx without edema or exudate. Dentition: poor Lungs: decreased BS bases Heart: HRRR S1 S2, no rub/gallop, no sig murmur. pulses are 2+ upper extrem, decreased in both LE Neck: No carotid bruits. No lymphadenopathy.   JVD not elevated Abdomen: Bowel sounds present, abdomen soft and non-tender without masses or hernias noted. Msk:  No spine or cva tenderness. No weakness, no joint deformities or effusions. Extremities: No clubbing or cyanosis. No edema.  Neuro: Alert and oriented X 3. No focal deficits noted. Psych:  Good affect, responds appropriately Skin: No rashes or lesions noted. Sacral area bandaged and dressed, not disturbed  Labs:   Lab Results  Component Value Date   WBC 11.1* 12/25/2015   HGB 5.8* 12/25/2015   HCT 16.0* 12/25/2015   MCV 101.3* 12/25/2015   PLT 187 12/25/2015  Recent Labs  12/24/15 0528  INR 1.46     Recent Labs Lab 12/25/15 0445  NA 131*  K 2.6*  CL 102  CO2 22  BUN 11  CREATININE 0.81  CALCIUM 7.7*  PROT 5.2*  BILITOT 0.9  ALKPHOS 63  ALT 9*  AST 12*  GLUCOSE 111*  ALBUMIN 2.0*   MAGNESIUM  Date Value Ref Range Status  12/24/2015 1.5* 1.7 - 2.4 mg/dL Final    Recent Labs  12/24/15 0048 12/24/15 0528 12/24/15 1430 12/24/15 2030  TROPONINI 0.32* 0.34* 0.33* 0.23*   B NATRIURETIC PEPTIDE  Date/Time Value Ref Range Status  12/24/2015 02:30 PM 260.5* 0.0 - 100.0 pg/mL Final   LIPASE  Date/Time Value Ref Range Status  12/23/2015 04:05 PM 15 11 - 51 U/L Final   TSH  Date/Time Value Ref Range Status  12/24/2015 05:28 AM 0.472 0.350 - 4.500 uIU/mL Final   Echo: 12/24/2015 - Left ventricle: The cavity size was normal. Wall thickness was  normal. Systolic function was normal. The estimated ejection  fraction was in the range of 55% to 60%. Wall motion was normal;  there were no regional wall motion abnormalities. Doppler  parameters are consistent with abnormal left ventricular  relaxation (grade 1 diastolic dysfunction). Impressions: - Normal LV systolic function; grade 1 diastolic dysfunction; pacer  wire in RV.  ECG:  11/24/2015 SR, inferior Q waves  Radiology:  Ct Abdomen Pelvis W Contrast 12/23/2015  CLINICAL DATA:   Abdominal pain and diarrhea for a few months. 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 EXAM: CT ABDOMEN AND PELVIS WITH CONTRAST TECHNIQUE: Multidetector CT imaging of the abdomen and pelvis was performed using the standard protocol following bolus administration of intravenous contrast. CONTRAST:  34mL ISOVUE-300 IOPAMIDOL (ISOVUE-300) INJECTION 61% COMPARISON:  Outside CT from 11/03/2015.  Outside CT from 08/06/2014 FINDINGS: Lower chest:  Centrilobular emphysema noted in the lung bases. Hepatobiliary: Scattered tiny hypodensities in the liver parenchyma are stable. No enhancing lesion is identified within the liver parenchyma. There is no evidence for gallstones, gallbladder wall thickening, or pericholecystic fluid. Mild intrahepatic biliary duct dilatation has progressed slightly in the interval. Common bile duct in the head of the pancreas measures 8 mm in diameter today compared to 6 mm on 11/03/2015. Pancreas: Slight prominence of the main pancreatic duct, stable. Spleen: No splenomegaly. No focal mass lesion. Adrenals/Urinary Tract: No adrenal nodule or mass. Atrophic left kidney. No evidence for enhancing renal lesion with evidence of decreased perfusion of the left kidney. No evidence for hydroureter. Bladder is markedly distended. Stomach/Bowel: Stomach is nondistended. No gastric wall thickening. No evidence of outlet obstruction. Duodenum is normally positioned as is the ligament of Treitz. Small bowel loops are nondilated. Terminal ileum unremarkable. The appendix is not visualized, but there is no edema or inflammation in the region of the cecum. Colon is diffusely distended and stool-filled from the cecum to the rectum. Vascular/Lymphatic: There is abdominal aortic atherosclerosis without aneurysm. Abdominal aorta measures up to 2.9 cm in diameter. No evidence for gastrohepatic or hepatoduodenal ligament lymphadenopathy.  No abdominal retroperitoneal lymphadenopathy on the current study. No evidence for pelvic sidewall lymphadenopathy. Reproductive: The prostate gland and seminal vesicles have normal imaging features. Other: A large collection of fluid, debris, and gas is identified in the presacral space, between the rectum and the sacrum. The main component of this complex process measures 13.5 cm in coronal diameter by 9 cm in craniocaudal diameter  by 4.5 cm in AP diameter. There is extension of this process posteriorly in the right gluteus musculature, behind the right acetabulum. Gas dissects around to the right side of the rectum, displacing the rectum to the left. There is associated wall thickening in the rectum and perirectal edema/ inflammation. Musculoskeletal: Bone windows reveal no worrisome lytic or sclerotic osseous lesions. IMPRESSION: 1. Large complex collection of gas, debris, and fluid in the presacral space compatible with abscess. This dissects down around to the right of the rectum and laterally into the right gluteus musculature behind the right acetabulum. No direct communication to the rectal lumen is evident, but features are probably related to rectal perforation in this patient with a history of rectal carcinoma. 2. Markedly distended urinary bladder with the dome of the bladder cranial to the umbilicus. Imaging features are compatible with urinary retention. 3. Atrophic left kidney. 4. Scattered low-density liver lesions stable comparing back to the oldest outside imaging study of 08/06/2014. Imaging findings of potential clinical significance: Emphysema. [id number]) Aortic Atherosclerosis (ICD10-170.0) Electronically Signed   By: Misty Stanley M.D.   On: 12/23/2015 19:36   Dg Abd Acute W/chest 12/23/2015  CLINICAL DATA:  Abdominal pain for a few months. EXAM: DG ABDOMEN ACUTE W/ 1V CHEST COMPARISON:  No comparison studies available. FINDINGS: The lungs are clear wiithout focal pneumonia, edema,  pneumothorax or pleural effusion. The cardiopericardial silhouette is within normal limits for size. Left-sided pacer/AICD noted. A right Port-A-Cath is visualized with distal tip position overlying the distal SVC. Old posterior right rib fractures noted. Right-side-up decubitus film shows no evidence for intraperitoneal free air. Supine view the abdomen shows no gaseous bowel dilatation suggest obstruction. Prominent stool volume is seen along the length of the colon. Bones are diffusely demineralized. IMPRESSION: 1. No acute cardiopulmonary findings. 2. No evidence for bowel perforation or obstruction. 3. Large colonic stool volume. Imaging features could be compatible with clinical constipation. Electronically Signed   By: Misty Stanley M.D.   On: 12/23/2015 16:53   Ct Image Guided Drainage Percut Cath  Peritoneal Retroperit 12/24/2015  CLINICAL DATA:  Rectal carcinoma, post perforation, with large presacral abscess EXAM: CT GUIDED DRAINAGE OF PELVIC PRESACRAL ABSCESS ANESTHESIA/SEDATION: Intravenous Fentanyl and Versed were administered as conscious sedation during continuous monitoring of the patient's level of consciousness and physiological / cardiorespiratory status by the radiology RN, with a total moderate sedation time of 12 minutes. PROCEDURE: The procedure, risks, benefits, and alternatives were explained to the patient. Questions regarding the procedure were encouraged and answered. The patient understands and consents to the procedure. Patient placed prone. Select axial scans through the pelvis obtained. An appropriate skin entry site was determined and marked. The operative field was prepped with chlorhexidinein a sterile fashion, and a sterile drape was applied covering the operative field. A sterile gown and sterile gloves were used for the procedure. Local anesthesia was provided with 1% Lidocaine. Under CT fluoroscopic guidance, a 19 gauge percutaneous entry needle was advanced into the  collection from a rib left trans gluteal approach. Purulent material spontaneously returned through the needle lobe. An Amplatz wire advanced easily within the collection, its position confirmed on CT. Tract was dilated to facilitate placement of a 12 French pigtail catheter, placed centrally within the collection. 60 mL purulent material were aspirated, sent for routine Gram stain and culture. Catheter secured externally with 0 Prolene suture and stat lock, and placed to gravity drain bag. The patient tolerated the procedure well. COMPLICATIONS: None immediate FINDINGS: Complex presacral  gas and fluid collecting dissecting into the deep right gluteal muscular planes again identified. CT-guided abscess drain catheter placement from a left-sided approach was performed. IMPRESSION: 1. Technically successful CT-guided presacral pelvic abscess drain catheter placement Electronically Signed   By: Michael Wade M.D.   On: 12/24/2015 14:06    ASSESSMENT AND PLAN:   The patient was seen today by Dr Marlou Wade, the patient evaluated and the data reviewed.   1. Preop evaluation:  - Michael Wade is a 70 yo male w/ hx CTO RCA, EF previously 15% - his EF has normalized - he is not having ischemic symptoms - he has been compliant with f/u with Dr Caryl Wade, no events - continue to follow,   2. Hypokalemia  - IM supplementing - with VF history agree with aggressive supplementation - recheck w/ Mg in am  3. Hypomagnesemia - level was 1.5 on 07/01 - will supplement, give 2 gm IV  4.  CAD (coronary artery disease) - no ASA 2nd anemia - he is on BB - no statin 2nd abnl LFTs - no ischemic sx  Otherwise, per IM Active Problems:   Rectal cancer metastasized to lung [facility])   Prostate cancer ([facility])   Abscess, rectum   Hypertension   Urinary retention   Abscess, perirectal   Dehydration   Abdominal abscess [facility])   Encounter for palliative care   Goals of care, counseling/discussion   SignedRosaria Ferries,  PA-C 12/25/2015 11:27 AM Beeper [id number]  Co-Sign MD  Personally seen and examined. Agree with above. EF normal on recent check. Reassuring.  ICD in place Unfortunately emaciated.  If patient must undergo surgery, he is of moderate risk from cardiac perspective, but not as high risk as he once was when his EF was 15%. Goals are to continue all therapies (reviewed Palliative care note). Appreciate their continued role in helping to guide therapy.   Candee Furbish, MD     Will sign off. Please call if any concerns.

## 2015-12-25 NOTE — Progress Notes (Signed)
CRITICAL VALUE ALERT  Critical value received:  Potassium 2.6  Date of notification: 12/25/15  Time of notification:  0530  Critical value read back:Yes.    Nurse who received alert:  Mee Hives, Shelly Bombard  MD notified (1st page): NP on call  Time of first page:  0555  MD notified (2nd page):  Time of second page:  Responding MD:  NP on call  Time MD responded: new order placed for 6 runs of potassium by Provider.

## 2015-12-25 NOTE — Progress Notes (Signed)
PROGRESS NOTE  Michael Wade [id number] DOB: 12/31/1945 DOA: 12/23/2015 PCP: Pcp Not In System  HPI/Recap of past 24 hours:  Patient is a 70 year old male diagnosed with metastatic rectal cancer a year and a half ago as well as CAD status post V. fib arrest and ICD placement admitted on 7/1 with complaints of steadily worsening back and abdominal pain and in the emergency room patient was found to have a sacral abscess.  Patient admitted to hospitalist service and started on IV antibiotics. Palliative care consulted and after meeting with patient's sister, goals of care plan is for full aggressive interventions at this time. Seen by interventional radiology and underwent CT guided drainage with drain placement. Patient seen byoncology and has been undergoing radiation treatments, missing his most recent dose on 6/30 with 12 cycles left. There also recommending holding his further chemotherapy treatments until he is in better performance condition from this abscess. Seen by general surgery who are recommending possible loop colostomy for diversion, pending cardiac clearance.  Reportedly, patient had very poor ejection fraction of 15% based off of echocardiogram done 13 months ago. Repeat echocardiogram ordered which currently shows normal ejection fraction and only grade 1 diastolic dysfunction. 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 He is still at moderate risk for surgery given MAC patient.  Overnight, patient's hemoglobin dropped to 5.9 this morning. 2 units packed red blood cells ordered. This morning, patient complains of some backside soreness, otherwise doing okay. He feels very tired.   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 Surgery recommending diversion colostomy for now, pending cardiac clearance. Continue antibiotics    CAD (coronary artery disease) with history of V. fib arrest and ICD placement: Echocardiogram read at outside hospital 13 months ago noted markedly depressed EF and echocardiogram today only notes mild diastolic dysfunction. BNP stable. Cardiology confirmed patient's much stronger ejection fraction.    Encounter for palliative care: Appreciate palliative cares assistance. Sister who is medical power of attorney at this time wants everything done.  Had extensive discussion with the patient today about every that is being done. He stated to me that he did not want all of this, he did not want surgery and he does not want to be in the hospital. I talked to him about the need for surgery for the diverting colostomy so that his wound can heal so that he can resume chemotherapy and radiation. Patient stated that he understands his disease and that chemotherapy and radiation will not cure this and only by him some time although even that is limited. We talked about his family and sister who were afraid of losing him and how they want to do everything. We agreed that it would be best if we have a family meeting which included the patient, his sister and other family members plus palliative care for true goals of care meeting. Patient was quite amenable to this. I updated palliative care with this plan   Severe Protein calorie malnutrition/underweight: Patient meets criteria in the context of chronic illness. Seen by nutrition. On ensure twice a day plus mighty shake daily  Code Status: Full code   Family Communication: Left message for sister   Disposition Plan: Dependent on new goals of care meeting.   Consultants:  General  surgery  Medical oncology  Interventional radiology  Palliative care  Cardiology   Procedures:  Status post CT-guided drainage and drain  placement of rectal abscess done 7/2  Echocardiogram done 7/2: Grade 1 diastolic dysfunction, EF 0000000 percent   Antimicrobials:  IV Zosyn 7/1-present  IV vancomycin 7/1-present   DVT prophylaxis:  SCDs   Objective: Filed Vitals:   12/25/15 0428 12/25/15 0947 12/25/15 1005 12/25/15 1215  BP: 96/53 99/60 102/50 106/48  Pulse: 92 71 70 68  Temp: 98.7 F (37.1 C) 98.7 F (37.1 C) 98 F (36.7 C) 98.2 F (36.8 C)  TempSrc: Oral Oral Oral Oral  Resp: 16 18 20 20   Height:      Weight:      SpO2: 100% 100% 100% 100%    Intake/Output Summary (Last 24 hours) at 12/25/15 1246 Last data filed at 12/25/15 1215  Gross per 24 hour  Intake    938 ml  Output   2070 ml  Net  -1132 ml   Filed Weights   12/23/15 2353  Weight: 52.1 kg (114 lb 13.8 oz)    Exam:   General:  Alert and oriented 2, fatigued, emaciated   Cardiovascular: Regular rhythm, S1-S2   Respiratory: Clear to auscultation bilaterally   Abdomen: Soft, non-distended, nontender, hypoactive bowel sounds   Musculoskeletal: No clubbing or cyanosis or edema   Psychiatry: Seems appropriate, no evidence of psychoses    Data Reviewed: CBC:  Recent Labs Lab 12/18/15 1349 12/23/15 1605 12/24/15 0528 12/25/15 0445  WBC 17.8* 24.6* 21.7* 11.1*  NEUTROABS 16.2*  --   --   --   HGB 8.6* 8.1* 7.1* 5.8*  HCT 25.5* 22.5* 19.6* 16.0*  MCV 105.8* 99.6 101.6* 101.3*  PLT 180 245 209 [id number]   Basic Metabolic Panel:  Recent Labs Lab 12/18/15 1349 12/23/15 1605 12/24/15 0528 12/25/15 0445  NA 130* 129* 133* 131*  K 3.3* 3.2* 3.2* 2.6*  CL  --  97* 103 102  CO2 20* 21* 22 22  GLUCOSE 112 148* 123* 111*  BUN 13.5 26* 20 11  CREATININE 0.9 0.99 0.93 0.81  CALCIUM 9.4 8.7* 8.4* 7.7*  MG  --   --  1.5*  --   PHOS  --   --  2.1*  --    GFR: Estimated Creatinine Clearance: 64.3 mL/min (by C-G formula based on Cr of 0.81). Liver Function Tests:  Recent Labs Lab 12/18/15 1349 12/23/15 1605 12/24/15 0528  12/25/15 0445  AST 11 17 13* 12*  ALT 10 13* 10* 9*  ALKPHOS 117 98 83 63  BILITOT 0.70 0.9 0.9 0.9  PROT 7.1 7.0 6.3* 5.2*  ALBUMIN 2.6* 2.7* 2.4* 2.0*    Recent Labs Lab 12/23/15 1605  LIPASE 15   No results for input(s): AMMONIA in the last 168 hours. Coagulation Profile:  Recent Labs Lab 12/24/15 0528  INR 1.46   Cardiac Enzymes:  Recent Labs Lab 12/24/15 0048 12/24/15 0528 12/24/15 1430 12/24/15 2030  TROPONINI 0.32* 0.34* 0.33* 0.23*   BNP (last 3 results) No results for input(s): PROBNP in the last 8760 hours. HbA1C: No results for input(s): HGBA1C in the last 72 hours. CBG: No results for input(s): GLUCAP in the last 168 hours. Lipid Profile: No results for input(s): CHOL, HDL, LDLCALC, TRIG, CHOLHDL, LDLDIRECT in the last 72 hours. Thyroid Function Tests:  Recent Labs  12/24/15 0528  TSH 0.472   Anemia Panel: No results for input(s): VITAMINB12, FOLATE, FERRITIN, TIBC, IRON, RETICCTPCT in the  last 72 hours. Urine analysis:    Component Value Date/Time   COLORURINE YELLOW 12/23/2015 2020   APPEARANCEUR CLEAR 12/23/2015 2020   LABSPEC 1.019 12/23/2015 2020   PHURINE 6.0 12/23/2015 2020   GLUCOSEU NEGATIVE 12/23/2015 2020   HGBUR NEGATIVE 12/23/2015 2020   BILIRUBINUR NEGATIVE 12/23/2015 2020   KETONESUR NEGATIVE 12/23/2015 2020   PROTEINUR NEGATIVE 12/23/2015 2020   NITRITE NEGATIVE 12/23/2015 2020   LEUKOCYTESUR NEGATIVE 12/23/2015 2020   Sepsis Labs: @LABRCNTIP (procalcitonin:4,lacticidven:4)  ) Recent Results (from the past 240 hour(s))  Culture, blood (Routine X 2) w Reflex to ID Panel     Status: None (Preliminary result)   Collection Time: 12/23/15 11:38 PM  Result Value Ref Range Status   Specimen Description BLOOD RIGHT ARM  Final   Special Requests BOTTLES DRAWN AEROBIC AND ANAEROBIC 5CC  Final   Culture   Final    NO GROWTH 1 DAY Performed at [facility]    Report Status PENDING  Incomplete  Culture, blood  (Routine X 2) w Reflex to ID Panel     Status: None (Preliminary result)   Collection Time: 12/24/15 12:48 AM  Result Value Ref Range Status   Specimen Description BLOOD LEFT ARM  Final   Special Requests BOTTLES DRAWN AEROBIC AND ANAEROBIC 10CC  Final   Culture   Final    NO GROWTH 1 DAY Performed at [facility]    Report Status PENDING  Incomplete  C difficile quick scan w PCR reflex     Status: None   Collection Time: 12/24/15  2:55 AM  Result Value Ref Range Status   C Diff antigen NEGATIVE NEGATIVE Final   C Diff toxin NEGATIVE NEGATIVE Final   C Diff interpretation Negative for toxigenic C. difficile  Final  Aerobic/Anaerobic Culture (surgical/deep wound)     Status: None (Preliminary result)   Collection Time: 12/24/15 12:50 PM  Result Value Ref Range Status   Specimen Description ABSCESS  Final   Special Requests CT LEFT TRANSGLUTEAL  Final   Gram Stain   Final    ABUNDANT WBC PRESENT,BOTH PMN AND MONONUCLEAR NO SQUAMOUS EPITHELIAL CELLS SEEN ABUNDANT GRAM NEGATIVE RODS ABUNDANT GRAM POSITIVE COCCI IN CLUSTERS FEW GRAM POSITIVE RODS    Culture   Final    CULTURE REINCUBATED FOR BETTER GROWTH Performed at [facility]    Report Status PENDING  Incomplete      Studies: Ct Image Guided Drainage Percut Cath  Peritoneal Retroperit  12/24/2015  CLINICAL DATA:  Rectal carcinoma, post perforation, with large presacral abscess EXAM: CT GUIDED DRAINAGE OF PELVIC PRESACRAL ABSCESS ANESTHESIA/SEDATION: Intravenous Fentanyl and Versed were administered as conscious sedation during continuous monitoring of the patient's level of consciousness and physiological / cardiorespiratory status by the radiology RN, with a total moderate sedation time of 12 minutes. PROCEDURE: The procedure, risks, benefits, and alternatives were explained to the patient. Questions regarding the procedure were encouraged and answered. The patient understands and consents to the procedure. Patient  placed prone. Select axial scans through the pelvis obtained. An appropriate skin entry site was determined and marked. The operative field was prepped with chlorhexidinein a sterile fashion, and a sterile drape was applied covering the operative field. A sterile gown and sterile gloves were used for the procedure. Local anesthesia was provided with 1% Lidocaine. Under CT fluoroscopic guidance, a 19 gauge percutaneous entry needle was advanced into the collection from a rib left trans gluteal approach. Purulent material spontaneously returned through the needle  lobe. An Amplatz wire advanced easily within the collection, its position confirmed on CT. Tract was dilated to facilitate placement of a 12 French pigtail catheter, placed centrally within the collection. 60 mL purulent material were aspirated, sent for routine Gram stain and culture. Catheter secured externally with 0 Prolene suture and stat lock, and placed to gravity drain bag. The patient tolerated the procedure well. COMPLICATIONS: None immediate FINDINGS: Complex presacral gas and fluid collecting dissecting into the deep right gluteal muscular planes again identified. CT-guided abscess drain catheter placement from a left-sided approach was performed. IMPRESSION: 1. Technically successful CT-guided presacral pelvic abscess drain catheter placement Electronically Signed   By: Lucrezia Europe M.D.   On: 12/24/2015 14:06    Scheduled Meds: . sodium chloride   Intravenous Once  . carvedilol  6.25 mg Oral Q breakfast  . DULoxetine  30 mg Oral Daily  . feeding supplement (ENSURE ENLIVE)  237 mL Oral BID BM  . furosemide  20 mg Intravenous Once  . furosemide  20 mg Intravenous Once  . magnesium sulfate 1 - 4 g bolus IVPB  2 g Intravenous Once  . metoCLOPramide  10 mg Oral TID AC & HS  . ondansetron (ZOFRAN) IV  4 mg Intravenous Once  . piperacillin-tazobactam (ZOSYN)  IV  3.375 g Intravenous Q8H  . potassium chloride  10 mEq Intravenous Q1 Hr x 6    . sodium chloride flush  3 mL Intravenous Q12H  . tamsulosin  0.4 mg Oral Daily  . vancomycin  750 mg Intravenous Q12H    Continuous Infusions:    LOS: 2 days   Time spent: 15 minutes  Annita Brod, MD [facility] Pager [phone]  If 7PM-7AM, please contact night-coverage [email or web address] Password [email or web address] 12/25/2015, 12:46 PM

## 2015-12-25 NOTE — Progress Notes (Signed)
Initial Nutrition Assessment  DOCUMENTATION CODES:   Underweight, Severe malnutrition in context of chronic illness  INTERVENTION:  -Ensure Enlive po BID, each supplement provides 350 kcal and 20 grams of protein -Mighty Shake Q24H, each supplement provides 500 kcal and 23 grams of protein  NUTRITION DIAGNOSIS:   Malnutrition related to chronic illness as evidenced by severe depletion of body fat, severe depletion of muscle mass.  GOAL:   Patient will meet greater than or equal to 90% of their needs  MONITOR:   PO intake, Supplement acceptance, Labs, I & O's, Weight trends  REASON FOR ASSESSMENT:   Consult Assessment of nutrition requirement/status  ASSESSMENT:   Michael Wade is a 70 y.o. male who presented with complaint of abdominal pain/back pain and diarrhea that has been persistent for a few months.He was diagnosed with rectal cancer stage IV with metastasis to lung and lymph nodes in March 2016  Michael Wade is a pleasant 70 yo man who presents with good appetite, weight fluctuations over the past 2 months. Denies weight loss. Denies chewing/swallowing problems.  At home he said he does anywhere between 2-4 meals per day, "normal sized" with meat, vegetables, some starch.  This morning he had eggs, grits, orange juice, coffee and milk he said he consumed most of. Documented PO intake from yesterday 0-25%  Nutrition-Focused physical exam completed. Findings are severe fat depletion, severe muscle depletion, and no edema.   Pt states he normally drinks boost at home but does not like chocolate we have here. Agreeable to strawberry ensure and testing mighty shake x1/day  Patient may be meeting needs at home but with Stg IV CA certainly has increased nutrient needs. Very frail and thin.  Labs and medications reviewed: Na 131; K 2.6; Phos 2.1; Mg 1.5; Ca 7.7; KCL 67mEq  Diet Order:  DIET SOFT Room service appropriate?: Yes; Fluid consistency:: Thin  Skin:   Reviewed, no issues  Last BM:  7/2  Height:   Ht Readings from Last 1 Encounters:  12/23/15 5\' 11"  (1.803 m)    Weight:   Wt Readings from Last 1 Encounters:  12/23/15 114 lb 13.8 oz (52.1 kg)    Ideal Body Weight:  78.18 kg  BMI:  Body mass index is 16.03 kg/(m^2).  Estimated Nutritional Needs:   Kcal:  1800-2100 calories  Protein:  65-90 grams  Fluid:  >/= 1.8L  EDUCATION NEEDS:   No education needs identified at this time  Michael Wade. [name], MS, RD LDN Inpatient Clinical Dietitian Pager [phone]

## 2015-12-25 NOTE — Progress Notes (Addendum)
Daily Progress Note   Patient Name: Michael Wade       Date: 12/25/2015 DOB: 07/16/1945  Age: 70 y.o. MRN#: [id number] Attending Physician: Annita Brod, MD Primary Care Physician: Pcp Not In System Admit Date: 12/23/2015  Reason for Consultation/Follow-up: Establishing goals of care  Subjective: HPI: 70 year old gentleman originally from [place]. No spouse. Was diagnosed with cancer 16 months ago. Sister Michael Wade moved him to [place] to take care of him. He lives with his sister Michael Wade and his mother. 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 Also with reported history of prostate cancer coronary artery disease. Status post ICD placement last year. Patient complained of abdominal pain and back pain and diarrhea that has been persistent for the past few months. He has not been eating well. At times he has had chronic blood loss per rectum. No history of fever no history of nausea vomiting. Abdominal imaging in the emergency department showed a large complex collection of gas, debris, fluid and sacral space compatible with abscess. Patient has been admitted to hospitalist service since 7-1. He has been seen by general surgery and cardiology and now interventional radiology. Had CT-guided drainage/drain placement 12/24/15. He has been placed on antibiotics since admission. His pain is controlled. From oncology standpoint he was on radiation and was on Xeloda pills for rectal cancer. 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 He has been seen by general surgery and this admission. Final recommendations after interventional radiology transgluteal drain placement are  to consider possible loop colostomy in the operating room for divergent.  Interval history: Met with patient and sister, Michael Wade, and his mother at bedside today. After in depth discussion with Dr. Maryland Pink, and consulting with cardiology, patient has made the decision not to have surgery. He wants to continue antibiotics and other treatments, but no surgical interventions. He also does not want to be intubated or kept alive artificially. His sister, Michael Wade, and mother are supportive of his decision. Goals of care at this time are to treat what can be treated medically. Discussed that he may need some time spent in rehab facility before being able to go home and patient and family agree to this.  Length of Stay: 2  Current Medications: Scheduled Meds:  . sodium chloride   Intravenous Once  . carvedilol  6.25 mg Oral Q breakfast  . DULoxetine  30 mg Oral Daily  . feeding supplement (ENSURE ENLIVE)  237 mL Oral BID BM  . furosemide  20 mg Intravenous Once  . furosemide  20 mg Intravenous Once  . metoCLOPramide  10 mg Oral TID AC & HS  . ondansetron (ZOFRAN) IV  4 mg Intravenous Once  . piperacillin-tazobactam (ZOSYN)  IV  3.375 g Intravenous Q8H  . sodium chloride flush  3 mL Intravenous Q12H  . tamsulosin  0.4 mg Oral Daily  . vancomycin  750 mg Intravenous Q12H    Continuous Infusions:    PRN Meds: acetaminophen **OR** acetaminophen, HYDROmorphone (DILAUDID) injection, ondansetron **OR** ondansetron (ZOFRAN) IV, oxyCODONE-acetaminophen **AND** oxyCODONE, sodium chloride flush  Physical Exam  Constitutional: He is oriented to person, place, and time.  Frail  HENT:  Head: Normocephalic and atraumatic.  Eyes: EOM are normal.  Cardiovascular: Normal rate.   Pulmonary/Chest: Effort normal.  Neurological: He is alert and oriented to person, place, and time.  Psychiatric: He has a normal mood and affect. Judgment normal.            Vital Signs: BP 105/62 mmHg  Pulse 67  Temp(Src)  98.3 F (36.8 C) (Oral)  Resp 20  Ht 5' 11"  (1.803 m)  Wt 52.1 kg (114 lb 13.8 oz)  BMI 16.03 kg/m2  SpO2 100% SpO2: SpO2: 100 % O2 Device: O2 Device: Not Delivered O2 Flow Rate: O2 Flow Rate (L/min): 2 L/min  Intake/output summary:  Intake/Output Summary (Last 24 hours) at 12/25/15 1412 Last data filed at 12/25/15 1215  Gross per 24 hour  Intake    938 ml  Output   2070 ml  Net  -1132 ml   LBM: Last BM Date: 12/24/15 Baseline Weight: Weight: 52.1 kg (114 lb 13.8 oz) Most recent weight: Weight: 52.1 kg (114 lb 13.8 oz)       Palliative Assessment/Data: 30%   Flowsheet Rows        Most Recent Value   Intake Tab    Referral Department  Hospitalist   Unit at Time of Referral  Oncology Unit   Palliative Care Primary Diagnosis  Cancer   Palliative Care Type  New Palliative care   Reason for referral  Clarify Goals of Care   Date first seen by Palliative Care  12/24/15   Clinical Assessment    Palliative Performance Scale Score  30%   Pain Max last 24 hours  5   Pain Min Last 24 hours  4   Psychosocial & Spiritual Assessment    Palliative Care Outcomes    Patient/Family meeting held?  Yes   Who was at the meeting?  sister Michael Wade       Patient Active Problem List   Diagnosis Date Noted  . Underweight   . Protein-calorie malnutrition, severe ([place])   . Abdominal abscess ([place])   . Encounter for palliative care   . Goals of care, counseling/discussion   . Abscess, rectum 12/23/2015  . CAD (coronary artery disease) 12/23/2015  . Hypertension 12/23/2015  . Urinary retention 12/23/2015  . Abscess, perirectal 12/23/2015  . Dehydration 12/23/2015  . Port catheter in place 12/05/2015  . 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 Prostate cancer [facility]) 10/24/2015    Palliative Care Assessment & Plan   Patient Profile:   Assessment/Recommendations/Plan:  -  No surgery per patient's wishes -Continue antibiotics and other treatments as  needed -Continue to monitor progress, functional status, pain management -Rehab facility at discharge -Code status change to partial- DNI -Family requests that we contact Dr. Ida Rogue office and notify of patient's status- will send message via EPIC to Dr. Lisbeth Renshaw -PMT will continue to follow patient and provide assistance as needed    Goals of Care and Additional Recommendations:  Limitations on Scope of Treatment: No Surgical Procedures  Code Status:    Code Status Orders        Start     Ordered   12/25/15 1412  Limited resuscitation (code)   Continuous    Question Answer Comment  In the event of cardiac or respiratory ARREST: Initiate Code Blue, Call Rapid Response Yes   In the event of cardiac or respiratory ARREST: Perform CPR Yes   In the event of cardiac or respiratory ARREST: Perform Intubation/Mechanical Ventilation No   In the event of cardiac or respiratory ARREST: Use NIPPV/BiPAp only if indicated Yes   In the event of cardiac or respiratory ARREST: Administer ACLS medications if indicated Yes   In the event of cardiac or respiratory ARREST: Perform Defibrillation or Cardioversion if indicated Yes      12/25/15 1412    Code Status History    Date Active Date Inactive Code Status Order ID Comments User Context   12/23/2015 11:37 PM 12/25/2015  2:12 PM Full Code [id number]  Toy Baker, MD ED       Prognosis:  Unable to prognosticate now, however, prognosis remains guarded due to multiple life limiting illnesses   Discharge Planning:  To Be Determined (likely Rehab with possible Palliative followup)  Care plan was discussed with patient, sister Michael Wade), mother.  Thank you for allowing the Palliative Medicine Team to assist in the care of this patient.   Time In: 1315 Time Out: 1400 Total Time 45 min Prolonged Time Billed No      Greater than 50%  of this time was spent counseling and coordinating care related to the above assessment and  plan.  Mariana Kaufman, AGNP-C [phone]  Please contact Palliative Medicine Team phone at [phone] for questions and concerns.   Addendum: Patient seen and examined Agree with note above Discussed with Dr Maryland Pink, and with patient, sister and mother extensively along with Ms Juanda Crumble today Palliative will continue to follow along and help guide appropriate decision making.   Loistine Chance MD [facility] health palliative medicine team [phone] office

## 2015-12-25 NOTE — Progress Notes (Signed)
Patient ID: Michael Wade, male   DOB: 1945-12-25, 70 y.o.   MRN: [id number]    Referring Physician(s): Joyice Faster  Supervising Physician: Corrie Mckusick  Patient Status:  Inpatient  Chief Complaint: Pelvic abscess   Subjective: Pt doing fair; still weak; has some abd discomfort but improved since yesterday; currently receiving blood   Allergies: Review of patient's allergies indicates no known allergies.  Medications: Prior to Admission medications   Medication Sig Start Date End Date Taking? Authorizing Provider  capecitabine (XELODA) 500 MG tablet Take 3 tablets (1,500 mg) QAM 2 tabs (1,000 mg) QPM on days of radiation only (Mon-Fri) 11/08/15  Yes Ladell Pier, MD  carvedilol (COREG) 6.25 MG tablet Take 6.25 mg by mouth daily. 07/12/15 07/11/16 Yes Historical Provider, MD  DULoxetine (CYMBALTA) 30 MG capsule Take 1 capsule (30 mg total) by mouth daily. 12/18/15  Yes Owens Shark, NP  ferrous sulfate 325 (65 FE) MG tablet Take 325 mg by mouth daily with breakfast.   Yes Historical Provider, MD  furosemide (LASIX) 40 MG tablet Take 0.5 tablets (20 mg total) by mouth daily. 12/15/15  Yes Hayden Pedro, PA-C  megestrol (MEGACE) 400 MG/10ML suspension Take 5 mLs (200 mg total) by mouth 2 (two) times daily. 12/08/15  Yes Hayden Pedro, PA-C  oxyCODONE-acetaminophen (PERCOCET) 10-325 MG tablet Take 1 tablet by mouth every 4 (four) hours as needed for pain. 12/08/15  Yes Hayden Pedro, PA-C  pantoprazole (PROTONIX) 40 MG tablet Take 1 tablet (40 mg total) by mouth daily. 12/18/15 12/17/16 Yes Owens Shark, NP  potassium chloride SA (K-DUR,KLOR-CON) 20 MEQ tablet Take 1 tablet (20 mEq total) by mouth daily. 12/19/15  Yes Owens Shark, NP  psyllium (REGULOID) 0.52 g capsule Take 0.52 g by mouth as directed.   Yes Historical Provider, MD  pyridOXINE (B-6) 50 MG tablet Take 50 mg by mouth daily.   Yes Historical Provider, MD  metoCLOPramide (REGLAN) 10 MG tablet Take 1  tablet (10 mg total) by mouth 4 (four) times daily. Patient not taking: Reported on 12/23/2015 12/19/15   Kyung Rudd, MD     Vital Signs: BP 105/62 mmHg  Pulse 67  Temp(Src) 98.3 F (36.8 C) (Oral)  Resp 20  Ht 5\' 11"  (1.803 m)  Wt 114 lb 13.8 oz (52.1 kg)  BMI 16.03 kg/m2  SpO2 100%  Physical Exam pelvic drain intact, insertion site ok; mildly tender; output 120+ cc's green purulent fluid; cx's pend  Imaging: Ct Abdomen Pelvis W Contrast  12/23/2015  CLINICAL DATA:  Abdominal pain and diarrhea for a few months. 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 EXAM: CT ABDOMEN AND PELVIS WITH CONTRAST TECHNIQUE: Multidetector CT imaging of the abdomen and pelvis was performed using the standard protocol following bolus administration of intravenous contrast. CONTRAST:  46mL ISOVUE-300 IOPAMIDOL (ISOVUE-300) INJECTION 61% COMPARISON:  Outside CT from 11/03/2015.  Outside CT from 08/06/2014 FINDINGS: Lower chest:  Centrilobular emphysema noted in the lung bases. Hepatobiliary: Scattered tiny hypodensities in the liver parenchyma are stable. No enhancing lesion is identified within the liver parenchyma. There is no evidence for gallstones, gallbladder wall thickening, or pericholecystic fluid. Mild intrahepatic biliary duct dilatation has progressed slightly in the interval. Common bile duct in the head of the pancreas measures 8 mm in diameter today compared to 6 mm on 11/03/2015. Pancreas: Slight prominence of the main pancreatic duct, stable. Spleen: No splenomegaly. No focal mass lesion. Adrenals/Urinary  Tract: No adrenal nodule or mass. Atrophic left kidney. No evidence for enhancing renal lesion with evidence of decreased perfusion of the left kidney. No evidence for hydroureter. Bladder is markedly distended. Stomach/Bowel: Stomach is nondistended. No gastric wall thickening. No evidence of outlet obstruction. Duodenum is  normally positioned as is the ligament of Treitz. Small bowel loops are nondilated. Terminal ileum unremarkable. The appendix is not visualized, but there is no edema or inflammation in the region of the cecum. Colon is diffusely distended and stool-filled from the cecum to the rectum. Vascular/Lymphatic: There is abdominal aortic atherosclerosis without aneurysm. Abdominal aorta measures up to 2.9 cm in diameter. No evidence for gastrohepatic or hepatoduodenal ligament lymphadenopathy. No abdominal retroperitoneal lymphadenopathy on the current study. No evidence for pelvic sidewall lymphadenopathy. Reproductive: The prostate gland and seminal vesicles have normal imaging features. Other: A large collection of fluid, debris, and gas is identified in the presacral space, between the rectum and the sacrum. The main component of this complex process measures 13.5 cm in coronal diameter by 9 cm in craniocaudal diameter by 4.5 cm in AP diameter. There is extension of this process posteriorly in the right gluteus musculature, behind the right acetabulum. Gas dissects around to the right side of the rectum, displacing the rectum to the left. There is associated wall thickening in the rectum and perirectal edema/ inflammation. Musculoskeletal: Bone windows reveal no worrisome lytic or sclerotic osseous lesions. IMPRESSION: 1. Large complex collection of gas, debris, and fluid in the presacral space compatible with abscess. This dissects down around to the right of the rectum and laterally into the right gluteus musculature behind the right acetabulum. No direct communication to the rectal lumen is evident, but features are probably related to rectal perforation in this patient with a history of rectal carcinoma. 2. Markedly distended urinary bladder with the dome of the bladder cranial to the umbilicus. Imaging features are compatible with urinary retention. 3. Atrophic left kidney. 4. Scattered low-density liver lesions  stable comparing back to the oldest outside imaging study of 08/06/2014. Imaging findings of potential clinical significance: Emphysema. [id number]) Aortic Atherosclerosis (ICD10-170.0) Electronically Signed   By: Misty Stanley M.D.   On: 12/23/2015 19:36   Dg Abd Acute W/chest  12/23/2015  CLINICAL DATA:  Abdominal pain for a few months. EXAM: DG ABDOMEN ACUTE W/ 1V CHEST COMPARISON:  No comparison studies available. FINDINGS: The lungs are clear wiithout focal pneumonia, edema, pneumothorax or pleural effusion. The cardiopericardial silhouette is within normal limits for size. Left-sided pacer/AICD noted. A right Port-A-Cath is visualized with distal tip position overlying the distal SVC. Old posterior right rib fractures noted. Right-side-up decubitus film shows no evidence for intraperitoneal free air. Supine view the abdomen shows no gaseous bowel dilatation suggest obstruction. Prominent stool volume is seen along the length of the colon. Bones are diffusely demineralized. IMPRESSION: 1. No acute cardiopulmonary findings. 2. No evidence for bowel perforation or obstruction. 3. Large colonic stool volume. Imaging features could be compatible with clinical constipation. Electronically Signed   By: Misty Stanley M.D.   On: 12/23/2015 16:53   Ct Image Guided Drainage Percut Cath  Peritoneal Retroperit  12/24/2015  CLINICAL DATA:  Rectal carcinoma, post perforation, with large presacral abscess EXAM: CT GUIDED DRAINAGE OF PELVIC PRESACRAL ABSCESS ANESTHESIA/SEDATION: Intravenous Fentanyl and Versed were administered as conscious sedation during continuous monitoring of the patient's level of consciousness and physiological / cardiorespiratory status by the radiology RN, with a total moderate sedation time of 12 minutes. PROCEDURE:  The procedure, risks, benefits, and alternatives were explained to the patient. Questions regarding the procedure were encouraged and answered. The patient understands and consents  to the procedure. Patient placed prone. Select axial scans through the pelvis obtained. An appropriate skin entry site was determined and marked. The operative field was prepped with chlorhexidinein a sterile fashion, and a sterile drape was applied covering the operative field. A sterile gown and sterile gloves were used for the procedure. Local anesthesia was provided with 1% Lidocaine. Under CT fluoroscopic guidance, a 19 gauge percutaneous entry needle was advanced into the collection from a rib left trans gluteal approach. Purulent material spontaneously returned through the needle lobe. An Amplatz wire advanced easily within the collection, its position confirmed on CT. Tract was dilated to facilitate placement of a 12 French pigtail catheter, placed centrally within the collection. 60 mL purulent material were aspirated, sent for routine Gram stain and culture. Catheter secured externally with 0 Prolene suture and stat lock, and placed to gravity drain bag. The patient tolerated the procedure well. COMPLICATIONS: None immediate FINDINGS: Complex presacral gas and fluid collecting dissecting into the deep right gluteal muscular planes again identified. CT-guided abscess drain catheter placement from a left-sided approach was performed. IMPRESSION: 1. Technically successful CT-guided presacral pelvic abscess drain catheter placement Electronically Signed   By: Lucrezia Europe M.D.   On: 12/24/2015 14:06    Labs:  CBC:  Recent Labs  12/18/15 1349 12/23/15 1605 12/24/15 0528 12/25/15 0445  WBC 17.8* 24.6* 21.7* 11.1*  HGB 8.6* 8.1* 7.1* 5.8*  HCT 25.5* 22.5* 19.6* 16.0*  PLT 180 245 209 187    COAGS:  Recent Labs  12/24/15 0528  INR 1.46    BMP:  Recent Labs  12/18/15 1349 12/23/15 1605 12/24/15 0528 12/25/15 0445  NA 130* 129* 133* 131*  K 3.3* 3.2* 3.2* 2.6*  CL  --  97* 103 102  CO2 20* 21* 22 22  GLUCOSE 112 148* 123* 111*  BUN 13.5 26* 20 11  CALCIUM 9.4 8.7* 8.4* 7.7*    CREATININE 0.9 0.99 0.93 0.81  GFRNONAA  --  >60 >60 >60  GFRAA  --  >60 >60 >60    LIVER FUNCTION TESTS:  Recent Labs  12/18/15 1349 12/23/15 1605 12/24/15 0528 12/25/15 0445  BILITOT 0.70 0.9 0.9 0.9  AST 11 17 13* 12*  ALT 10 13* 10* 9*  ALKPHOS 117 98 83 63  PROT 7.1 7.0 6.3* 5.2*  ALBUMIN 2.6* 2.7* 2.4* 2.0*    Assessment and Plan: S/p presacral abscess drain 7/2 (hx stage IV rectal ca); AF; WBC 11.1(21.7), hgb 5.8 (7.1)- transfuse; creat nl; K 2.6- replace; c diff neg; check final  blood/ abscess cx's/sens; tent for lap diverting colostomy on 7/5 per [facility]   Electronically Signed: D. Rowe Robert 12/25/2015, 2:52 PM   I spent a total of 15 minutes at the the patient's bedside AND on the patient's hospital floor or unit, greater than 50% of which was counseling/coordinating care for pelvic abscess drain

## 2015-12-25 NOTE — Consult Note (Signed)
[place] ostomy consult note Patient consult requested for preoperative stoma site selection for possible ostomy surgery on 12/27/15, but as of this afternoon and the most recent Palliative Care note, patient is refusing surgery.  I have consulted with patient's bedside RN who informs me that patient is adamant that he can do "no more" and that his sister is finding his decisions to be "difficult", but "understandable.". Patient is not marked today.  If the situation changes and you desire marking, please reconsult. [facility] nursing team will not follow, but will remain available to this patient, the nursing and medical teams.  Please re-consult if needed. Thanks, Maudie Flakes, MSN, RN, [place], Arther Abbott  Pager# [phone]

## 2015-12-25 NOTE — Progress Notes (Signed)
Patient with a critical hgb of 5.8. Call received from the lab at [facility]. Practitioner on call notified at [facility]. New order placed for 2 units of PRBC. Blood bank  notified of blood order at 0519. Blood band to call this RN when blood is ready. Vwilliams, rn.

## 2015-12-25 NOTE — Progress Notes (Signed)
Pharmacy Antibiotic Note  Michael Wade is a 70 y.o. male admitted on 12/23/2015 with large perrectal abscess.   PMH includes colorectal cancer, HTN & CAD.  H/O rectal abscesses in the past requiring IV abx.  Currently on day 2 vanc/Zosyn per pharmacy. Transgluteal abscess drain placed 7/2; plan is for diverting colostomy pending cardiology clearance.  Today, 12/25/2015: WBCs much improved after drain placement; remains afebrile.  SCr sl improved  Plan:  Continue Zosyn 3.375 g IV given every 8 hrs by 4-hr infusion  With improved renal function and staph likely, will increase vancomycin to 750 mg IV q12 hr, targeting troughs of 15-20 mcg/ml until MRSA r/o  Will need "long course" of IV abx for abscess per Onc  F/U cultures/sensitivities  Follow renal function   Height: 5\' 11"  (180.3 cm) Weight: 114 lb 13.8 oz (52.1 kg) IBW/kg (Calculated) : 75.3  Temp (24hrs), Avg:98.6 F (37 C), Min:98 F (36.7 C), Max:99.1 F (37.3 C)   Recent Labs Lab 12/18/15 1349 12/18/15 1349 12/23/15 1605 12/24/15 0528 12/25/15 0445  WBC 17.8*  --  24.6* 21.7* 11.1*  CREATININE  --  0.9 0.99 0.93 0.81    Estimated Creatinine Clearance: 64.3 mL/min (by C-G formula based on Cr of 0.81).    No Known Allergies  Antimicrobials this admission: 7/1 Zosyn >>   7/2 Vanc >>    Dose adjustments this admission: ---  Microbiology results: 7/1 BCx: IP 7/1 CDiff: -/- 7/2 perirectal abscess: abundant GNR, GPCs in clusters, few GPRs   Thank you for allowing pharmacy to be a part of this patient's care.  Reuel Boom, PharmD, BCPS Pager: [phone] 12/25/2015, 7:55 AM

## 2015-12-25 NOTE — Evaluation (Signed)
Clinical/Bedside Swallow Evaluation Patient Details  Name: Michael Wade MRN: [id number] Date of Birth: 01/26/1946  Today's Date: 12/25/2015 Time: SLP Start Time (ACUTE ONLY): 1250 SLP Stop Time (ACUTE ONLY): 1300 SLP Time Calculation (min) (ACUTE ONLY): 10 min  Past Medical History:  Past Medical History  Diagnosis Date  . 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 CAD (coronary artery disease) 09/2014    CTO RCA  . HTN (hypertension)   . Prostate cancer ([place])   . Rectal cancer ([place])   . Colon cancer [facility])    Past Surgical History:  Past Surgical History  Procedure Laterality Date  . Knee arthroscopy Left   . Cardiac defibrillator placement  10/25/2014    [employer]  . Cardiac catheterization  10/20/2014    CTO RCA w/ collat, single vessel dz, med rx   HPI:  Patient is a 70 year old male diagnosed with metastatic rectal cancer a year and a half ago as well as CAD status post V. fib arrest and ICD placement admitted on 7/1 with complaints of steadily worsening back and abdominal pain and in the emergency room patient was found to have a sacral abscess.Pt reported some difficulty swallowing to RN, arousal initially poor upon admission. The lungs are clear wiithout focal pneumonia, edema, pneumothorax or pleural effusion. Pt complains of hiccoughs with meals   Assessment / Plan / Recommendation Clinical Impression  Pt demonstrates normal swallow, only complaint is hiccups with meals which was not observed today. Pt reports he has been given medication but it is not effective. SLP suggested aating a portion of meal with long rest before finishing to see if it reduces hiccup response. No SLP f/u needed pt may continue current diet.     Aspiration Risk  Mild aspiration risk    Diet Recommendation Regular;Thin liquid   Liquid Administration via: Cup;Straw Medication Administration: Whole meds with liquid Supervision: Patient able to self feed Postural Changes: Seated upright at 90  degrees;Remain upright for at least 30 minutes after po intake    Other  Recommendations     Follow up Recommendations  None    Frequency and Duration            Prognosis        Swallow Study   General HPI: Patient is a 70 year old male diagnosed with metastatic rectal cancer a year and a half ago as well as CAD status post V. fib arrest and ICD placement admitted on 7/1 with complaints of steadily worsening back and abdominal pain and in the emergency room patient was found to have a sacral abscess.Pt reported some difficulty swallowing to RN, arousal initially poor upon admission. The lungs are clear wiithout focal pneumonia, edema, pneumothorax or pleural effusion. Pt complains of hiccoughs with meals Type of Study: Bedside Swallow Evaluation Previous Swallow Assessment: none Diet Prior to this Study: Regular;Thin liquids Temperature Spikes Noted: No Respiratory Status: Room air History of Recent Intubation: No Behavior/Cognition: Alert;Cooperative;Pleasant mood Oral Cavity Assessment: Within Functional Limits Oral Care Completed by SLP: No Oral Cavity - Dentition: Missing dentition Vision: Functional for self-feeding Self-Feeding Abilities: Able to feed self Patient Positioning: Upright in bed Baseline Vocal Quality: Normal Volitional Cough: Strong Volitional Swallow: Able to elicit    Oral/Motor/Sensory Function Overall Oral Motor/Sensory Function: Within functional limits   Ice Chips     Thin Liquid Thin Liquid: Within functional limits Presentation: Cup;Straw;Self Fed    Nectar Thick Nectar Thick Liquid: Not tested   Honey Thick Honey Thick Liquid: Not tested  Puree Puree: Within functional limits   Solid   GO   Solid: Within functional limits       [facility], MA CCC-SLP [id number]  Lynann Beaver 12/25/2015,1:34 PM

## 2015-12-25 NOTE — Progress Notes (Signed)
Michael Wade   DOB:[birth year]   [id number]   [id number]  Oncology follow-up  Subjective: Pt underwent the pelvic abscess drainage by IR yesterday, as moderate pain at the draining tube site, and some abdominal pain, no fever or other new complains. He ate well today. His sister was not at the bedside when I saw him. He was receiving blood transfusion.   Objective:  Filed Vitals:   12/25/15 1350 12/25/15 1525  BP: 105/62 112/55  Pulse: 67 70  Temp: 98.3 F (36.8 C) 97.8 F (36.6 C)  Resp: 20 18    Body mass index is 16.03 kg/(m^2).  Intake/Output Summary (Last 24 hours) at 12/25/15 1812 Last data filed at 12/25/15 1700  Gross per 24 hour  Intake   1523 ml  Output   3150 ml  Net  -1627 ml     Sclerae unicteric  Oropharynx clear  No peripheral adenopathy  Lungs clear -- no rales or rhonchi  Heart regular rate and rhythm  Abdomen Diffuse mild tenderness in the mid abdomen, bowel sounds normal.   MSK no focal spinal tenderness, no peripheral edema  Neuro nonfocal   CBG (last 3)  No results for input(s): GLUCAP in the last 72 hours.   Labs:  Lab Results  Component Value Date   WBC 11.1* 12/25/2015   HGB 5.8* 12/25/2015   HCT 16.0* 12/25/2015   MCV 101.3* 12/25/2015   PLT 187 12/25/2015   NEUTROABS 16.2* 12/18/2015    @LASTCHEMISTRY @  Urine Studies No results for input(s): UHGB, CRYS in the last 72 hours.  Invalid input(s): UACOL, UAPR, USPG, UPH, UTP, UGL, UKET, UBIL, UNIT, UROB, ULEU, UEPI, UWBC, [place], [place], [place], [place], [place]  Basic Metabolic Panel:  Recent Labs Lab 12/23/15 1605 12/24/15 0528 12/25/15 0445  NA 129* 133* 131*  K 3.2* 3.2* 2.6*  CL 97* 103 102  CO2 21* 22 22  GLUCOSE 148* 123* 111*  BUN 26* 20 11  CREATININE 0.99 0.93 0.81  CALCIUM 8.7* 8.4* 7.7*  MG  --  1.5*  --   PHOS  --  2.1*  --    GFR Estimated Creatinine Clearance: 64.3 mL/min (by C-G formula based on Cr of 0.81). Liver Function Tests:  Recent Labs Lab  12/23/15 1605 12/24/15 0528 12/25/15 0445  AST 17 13* 12*  ALT 13* 10* 9*  ALKPHOS 98 83 63  BILITOT 0.9 0.9 0.9  PROT 7.0 6.3* 5.2*  ALBUMIN 2.7* 2.4* 2.0*    Recent Labs Lab 12/23/15 1605  LIPASE 15   No results for input(s): AMMONIA in the last 168 hours. Coagulation profile  Recent Labs Lab 12/24/15 0528  INR 1.46    CBC:  Recent Labs Lab 12/23/15 1605 12/24/15 0528 12/25/15 0445  WBC 24.6* 21.7* 11.1*  HGB 8.1* 7.1* 5.8*  HCT 22.5* 19.6* 16.0*  MCV 99.6 101.6* 101.3*  PLT 245 209 187   Cardiac Enzymes:  Recent Labs Lab 12/24/15 0048 12/24/15 0528 12/24/15 1430 12/24/15 2030  TROPONINI 0.32* 0.34* 0.33* 0.23*   BNP: Invalid input(s): POCBNP CBG: No results for input(s): GLUCAP in the last 168 hours. D-Dimer No results for input(s): DDIMER in the last 72 hours. Hgb A1c No results for input(s): HGBA1C in the last 72 hours. Lipid Profile No results for input(s): CHOL, HDL, LDLCALC, TRIG, CHOLHDL, LDLDIRECT in the last 72 hours. Thyroid function studies  Recent Labs  12/24/15 0528  TSH 0.472   Anemia work up No results for input(s): VITAMINB12, FOLATE, FERRITIN, TIBC, IRON,  RETICCTPCT in the last 72 hours. Microbiology Recent Results (from the past 240 hour(s))  Culture, blood (Routine X 2) w Reflex to ID Panel     Status: None (Preliminary result)   Collection Time: 12/23/15 11:38 PM  Result Value Ref Range Status   Specimen Description BLOOD RIGHT ARM  Final   Special Requests BOTTLES DRAWN AEROBIC AND ANAEROBIC 5CC  Final   Culture   Final    NO GROWTH 1 DAY Performed at [facility]    Report Status PENDING  Incomplete  Culture, blood (Routine X 2) w Reflex to ID Panel     Status: None (Preliminary result)   Collection Time: 12/24/15 12:48 AM  Result Value Ref Range Status   Specimen Description BLOOD LEFT ARM  Final   Special Requests BOTTLES DRAWN AEROBIC AND ANAEROBIC 10CC  Final   Culture   Final    NO GROWTH 1  DAY Performed at [facility]    Report Status PENDING  Incomplete  C difficile quick scan w PCR reflex     Status: None   Collection Time: 12/24/15  2:55 AM  Result Value Ref Range Status   C Diff antigen NEGATIVE NEGATIVE Final   C Diff toxin NEGATIVE NEGATIVE Final   C Diff interpretation Negative for toxigenic C. difficile  Final  Aerobic/Anaerobic Culture (surgical/deep wound)     Status: None (Preliminary result)   Collection Time: 12/24/15 12:50 PM  Result Value Ref Range Status   Specimen Description ABSCESS  Final   Special Requests CT LEFT TRANSGLUTEAL  Final   Gram Stain   Final    ABUNDANT WBC PRESENT,BOTH PMN AND MONONUCLEAR NO SQUAMOUS EPITHELIAL CELLS SEEN ABUNDANT GRAM NEGATIVE RODS ABUNDANT GRAM POSITIVE COCCI IN CLUSTERS FEW GRAM POSITIVE RODS    Culture   Final    CULTURE REINCUBATED FOR BETTER GROWTH Performed at [facility]    Report Status PENDING  Incomplete      Studies:  Ct Abdomen Pelvis W Contrast  12/23/2015  CLINICAL DATA:  Abdominal pain and diarrhea for a few months. 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 EXAM: CT ABDOMEN AND PELVIS WITH CONTRAST TECHNIQUE: Multidetector CT imaging of the abdomen and pelvis was performed using the standard protocol following bolus administration of intravenous contrast. CONTRAST:  69mL ISOVUE-300 IOPAMIDOL (ISOVUE-300) INJECTION 61% COMPARISON:  Outside CT from 11/03/2015.  Outside CT from 08/06/2014 FINDINGS: Lower chest:  Centrilobular emphysema noted in the lung bases. Hepatobiliary: Scattered tiny hypodensities in the liver parenchyma are stable. No enhancing lesion is identified within the liver parenchyma. There is no evidence for gallstones, gallbladder wall thickening, or pericholecystic fluid. Mild intrahepatic biliary duct dilatation has progressed slightly in the interval. Common bile duct in the head of the pancreas  measures 8 mm in diameter today compared to 6 mm on 11/03/2015. Pancreas: Slight prominence of the main pancreatic duct, stable. Spleen: No splenomegaly. No focal mass lesion. Adrenals/Urinary Tract: No adrenal nodule or mass. Atrophic left kidney. No evidence for enhancing renal lesion with evidence of decreased perfusion of the left kidney. No evidence for hydroureter. Bladder is markedly distended. Stomach/Bowel: Stomach is nondistended. No gastric wall thickening. No evidence of outlet obstruction. Duodenum is normally positioned as is the ligament of Treitz. Small bowel loops are nondilated. Terminal ileum unremarkable. The appendix is not visualized, but there is no edema or inflammation in the region of the cecum. Colon is diffusely  distended and stool-filled from the cecum to the rectum. Vascular/Lymphatic: There is abdominal aortic atherosclerosis without aneurysm. Abdominal aorta measures up to 2.9 cm in diameter. No evidence for gastrohepatic or hepatoduodenal ligament lymphadenopathy. No abdominal retroperitoneal lymphadenopathy on the current study. No evidence for pelvic sidewall lymphadenopathy. Reproductive: The prostate gland and seminal vesicles have normal imaging features. Other: A large collection of fluid, debris, and gas is identified in the presacral space, between the rectum and the sacrum. The main component of this complex process measures 13.5 cm in coronal diameter by 9 cm in craniocaudal diameter by 4.5 cm in AP diameter. There is extension of this process posteriorly in the right gluteus musculature, behind the right acetabulum. Gas dissects around to the right side of the rectum, displacing the rectum to the left. There is associated wall thickening in the rectum and perirectal edema/ inflammation. Musculoskeletal: Bone windows reveal no worrisome lytic or sclerotic osseous lesions. IMPRESSION: 1. Large complex collection of gas, debris, and fluid in the presacral space compatible  with abscess. This dissects down around to the right of the rectum and laterally into the right gluteus musculature behind the right acetabulum. No direct communication to the rectal lumen is evident, but features are probably related to rectal perforation in this patient with a history of rectal carcinoma. 2. Markedly distended urinary bladder with the dome of the bladder cranial to the umbilicus. Imaging features are compatible with urinary retention. 3. Atrophic left kidney. 4. Scattered low-density liver lesions stable comparing back to the oldest outside imaging study of 08/06/2014. Imaging findings of potential clinical significance: Emphysema. [id number]) Aortic Atherosclerosis (ICD10-170.0) Electronically Signed   By: Misty Stanley M.D.   On: 12/23/2015 19:36   Ct Image Guided Drainage Percut Cath  Peritoneal Retroperit  12/24/2015  CLINICAL DATA:  Rectal carcinoma, post perforation, with large presacral abscess EXAM: CT GUIDED DRAINAGE OF PELVIC PRESACRAL ABSCESS ANESTHESIA/SEDATION: Intravenous Fentanyl and Versed were administered as conscious sedation during continuous monitoring of the patient's level of consciousness and physiological / cardiorespiratory status by the radiology RN, with a total moderate sedation time of 12 minutes. PROCEDURE: The procedure, risks, benefits, and alternatives were explained to the patient. Questions regarding the procedure were encouraged and answered. The patient understands and consents to the procedure. Patient placed prone. Select axial scans through the pelvis obtained. An appropriate skin entry site was determined and marked. The operative field was prepped with chlorhexidinein a sterile fashion, and a sterile drape was applied covering the operative field. A sterile gown and sterile gloves were used for the procedure. Local anesthesia was provided with 1% Lidocaine. Under CT fluoroscopic guidance, a 19 gauge percutaneous entry needle was advanced into the  collection from a rib left trans gluteal approach. Purulent material spontaneously returned through the needle lobe. An Amplatz wire advanced easily within the collection, its position confirmed on CT. Tract was dilated to facilitate placement of a 12 French pigtail catheter, placed centrally within the collection. 60 mL purulent material were aspirated, sent for routine Gram stain and culture. Catheter secured externally with 0 Prolene suture and stat lock, and placed to gravity drain bag. The patient tolerated the procedure well. COMPLICATIONS: None immediate FINDINGS: Complex presacral gas and fluid collecting dissecting into the deep right gluteal muscular planes again identified. CT-guided abscess drain catheter placement from a left-sided approach was performed. IMPRESSION: 1. Technically successful CT-guided presacral pelvic abscess drain catheter placement Electronically Signed   By: Lucrezia Europe M.D.   On: 12/24/2015 14:06  Assessment: 70 y.o. with metastatic rectal cancer to lung diagnosed in 07/2014, currently on concurrent radiation and chemotherapy with Xeloda, early-stage prostate cancer diagnosed in 2060 on Lupron, prior history of epidural abscess status post long course of antibiotics 2 to 3 months ago, cardiomyopathy with cardiac arrest in 2016, status post ICD placement.  1. Large pelvic abscess, s/p percutaneous draining 7/2 2. Metastatic rectal cancer to lung, currently on concurrent chemoradiation to rectal primary 3. Early stage prostate cancer, on Lupron  4. 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 Coronary artery disease and Cardiomyopathy with EF 15%, history of cardiac arrest, status post ICD placement 6. Malnutrition 6. Anemia secondary to chemotherapy, iron deficiency, and abscess, worsening   Plan:  -agree with blood transfusion, please keep Hb>8  -Pt clearly stated that he does not want diverting colostomy. He understand that this maybe necessary to heal  his pelvic abscess.  -will continue holding chemo, and probably radiation also  -Dr. Learta Codding will follow him as outpt.  Please call us if you have questions.   Truitt Merle, MD 12/25/2015  6:12 PM

## 2015-12-25 NOTE — Progress Notes (Signed)
Subjective: He has allot going on this AM.  Being transfused, K+ being replaced.  He still has pain and says it's about the same.  Drain in place with greenish tinted purulent drainage.  Objective: Vital signs in last 24 hours: Temp:  [98 F (36.7 C)-99.1 F (37.3 C)] 98.7 F (37.1 C) (07/03 0428) Pulse Rate:  [76-103] 92 (07/03 0428) Resp:  [12-18] 16 (07/03 0428) BP: (96-120)/(48-69) 96/53 mmHg (07/03 0428) SpO2:  [95 %-100 %] 100 % (07/03 0428) Last BM Date: 12/24/15 350 PO recorded Drain 120 Stool x 3 Afebrile, VSS K+ 2.6 Troponins elevated WBC improved Significant anemia Intake/Output from previous day: 07/02 0701 - 07/03 0700 In: 350 [P.O.:350] Out: 3020 [Urine:2900; Drains:120] Intake/Output this shift:    General appearance: alert, cooperative and no distress Resp: clear to auscultation bilaterally GI: soft sore, foley in place.  Lab Results:   Recent Labs  12/24/15 0528 12/25/15 0445  WBC 21.7* 11.1*  HGB 7.1* 5.8*  HCT 19.6* 16.0*  PLT 209 187    BMET  Recent Labs  12/24/15 0528 12/25/15 0445  NA 133* 131*  K 3.2* 2.6*  CL 103 102  CO2 22 22  GLUCOSE 123* 111*  BUN 20 11  CREATININE 0.93 0.81  CALCIUM 8.4* 7.7*   PT/INR  Recent Labs  12/24/15 0528  LABPROT 17.3*  INR 1.46     Recent Labs Lab 12/18/15 1349 12/23/15 1605 12/24/15 0528 12/25/15 0445  AST 11 17 13* 12*  ALT 10 13* 10* 9*  ALKPHOS 117 98 83 63  BILITOT 0.70 0.9 0.9 0.9  PROT 7.1 7.0 6.3* 5.2*  ALBUMIN 2.6* 2.7* 2.4* 2.0*     Lipase     Component Value Date/Time   LIPASE 15 12/23/2015 1605     Studies/Results: Ct Abdomen Pelvis W Contrast  12/23/2015  CLINICAL DATA:  Abdominal pain and diarrhea for a few months. 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 EXAM: CT ABDOMEN AND PELVIS WITH CONTRAST TECHNIQUE: Multidetector CT imaging of the abdomen and pelvis was performed using  the standard protocol following bolus administration of intravenous contrast. CONTRAST:  58mL ISOVUE-300 IOPAMIDOL (ISOVUE-300) INJECTION 61% COMPARISON:  Outside CT from 11/03/2015.  Outside CT from 08/06/2014 FINDINGS: Lower chest:  Centrilobular emphysema noted in the lung bases. Hepatobiliary: Scattered tiny hypodensities in the liver parenchyma are stable. No enhancing lesion is identified within the liver parenchyma. There is no evidence for gallstones, gallbladder wall thickening, or pericholecystic fluid. Mild intrahepatic biliary duct dilatation has progressed slightly in the interval. Common bile duct in the head of the pancreas measures 8 mm in diameter today compared to 6 mm on 11/03/2015. Pancreas: Slight prominence of the main pancreatic duct, stable. Spleen: No splenomegaly. No focal mass lesion. Adrenals/Urinary Tract: No adrenal nodule or mass. Atrophic left kidney. No evidence for enhancing renal lesion with evidence of decreased perfusion of the left kidney. No evidence for hydroureter. Bladder is markedly distended. Stomach/Bowel: Stomach is nondistended. No gastric wall thickening. No evidence of outlet obstruction. Duodenum is normally positioned as is the ligament of Treitz. Small bowel loops are nondilated. Terminal ileum unremarkable. The appendix is not visualized, but there is no edema or inflammation in the region of the cecum. Colon is diffusely distended and stool-filled from the cecum to the rectum. Vascular/Lymphatic: There is abdominal aortic atherosclerosis without aneurysm. Abdominal aorta measures up to 2.9 cm in diameter. No evidence for gastrohepatic or hepatoduodenal ligament  lymphadenopathy. No abdominal retroperitoneal lymphadenopathy on the current study. No evidence for pelvic sidewall lymphadenopathy. Reproductive: The prostate gland and seminal vesicles have normal imaging features. Other: A large collection of fluid, debris, and gas is identified in the presacral space,  between the rectum and the sacrum. The main component of this complex process measures 13.5 cm in coronal diameter by 9 cm in craniocaudal diameter by 4.5 cm in AP diameter. There is extension of this process posteriorly in the right gluteus musculature, behind the right acetabulum. Gas dissects around to the right side of the rectum, displacing the rectum to the left. There is associated wall thickening in the rectum and perirectal edema/ inflammation. Musculoskeletal: Bone windows reveal no worrisome lytic or sclerotic osseous lesions. IMPRESSION: 1. Large complex collection of gas, debris, and fluid in the presacral space compatible with abscess. This dissects down around to the right of the rectum and laterally into the right gluteus musculature behind the right acetabulum. No direct communication to the rectal lumen is evident, but features are probably related to rectal perforation in this patient with a history of rectal carcinoma. 2. Markedly distended urinary bladder with the dome of the bladder cranial to the umbilicus. Imaging features are compatible with urinary retention. 3. Atrophic left kidney. 4. Scattered low-density liver lesions stable comparing back to the oldest outside imaging study of 08/06/2014. Imaging findings of potential clinical significance: Emphysema. [id number]) Aortic Atherosclerosis (ICD10-170.0) Electronically Signed   By: Misty Stanley M.D.   On: 12/23/2015 19:36   Dg Abd Acute W/chest  12/23/2015  CLINICAL DATA:  Abdominal pain for a few months. EXAM: DG ABDOMEN ACUTE W/ 1V CHEST COMPARISON:  No comparison studies available. FINDINGS: The lungs are clear wiithout focal pneumonia, edema, pneumothorax or pleural effusion. The cardiopericardial silhouette is within normal limits for size. Left-sided pacer/AICD noted. A right Port-A-Cath is visualized with distal tip position overlying the distal SVC. Old posterior right rib fractures noted. Right-side-up decubitus film shows no  evidence for intraperitoneal free air. Supine view the abdomen shows no gaseous bowel dilatation suggest obstruction. Prominent stool volume is seen along the length of the colon. Bones are diffusely demineralized. IMPRESSION: 1. No acute cardiopulmonary findings. 2. No evidence for bowel perforation or obstruction. 3. Large colonic stool volume. Imaging features could be compatible with clinical constipation. Electronically Signed   By: Misty Stanley M.D.   On: 12/23/2015 16:53   Ct Image Guided Drainage Percut Cath  Peritoneal Retroperit  12/24/2015  CLINICAL DATA:  Rectal carcinoma, post perforation, with large presacral abscess EXAM: CT GUIDED DRAINAGE OF PELVIC PRESACRAL ABSCESS ANESTHESIA/SEDATION: Intravenous Fentanyl and Versed were administered as conscious sedation during continuous monitoring of the patient's level of consciousness and physiological / cardiorespiratory status by the radiology RN, with a total moderate sedation time of 12 minutes. PROCEDURE: The procedure, risks, benefits, and alternatives were explained to the patient. Questions regarding the procedure were encouraged and answered. The patient understands and consents to the procedure. Patient placed prone. Select axial scans through the pelvis obtained. An appropriate skin entry site was determined and marked. The operative field was prepped with chlorhexidinein a sterile fashion, and a sterile drape was applied covering the operative field. A sterile gown and sterile gloves were used for the procedure. Local anesthesia was provided with 1% Lidocaine. Under CT fluoroscopic guidance, a 19 gauge percutaneous entry needle was advanced into the collection from a rib left trans gluteal approach. Purulent material spontaneously returned through the needle lobe. An Amplatz wire  advanced easily within the collection, its position confirmed on CT. Tract was dilated to facilitate placement of a 12 French pigtail catheter, placed centrally within  the collection. 60 mL purulent material were aspirated, sent for routine Gram stain and culture. Catheter secured externally with 0 Prolene suture and stat lock, and placed to gravity drain bag. The patient tolerated the procedure well. COMPLICATIONS: None immediate FINDINGS: Complex presacral gas and fluid collecting dissecting into the deep right gluteal muscular planes again identified. CT-guided abscess drain catheter placement from a left-sided approach was performed. IMPRESSION: 1. Technically successful CT-guided presacral pelvic abscess drain catheter placement Electronically Signed   By: Lucrezia Europe M.D.   On: 12/24/2015 14:06   Prior to Admission medications   Medication Sig Start Date End Date Taking? Authorizing Provider  capecitabine (XELODA) 500 MG tablet Take 3 tablets (1,500 mg) QAM 2 tabs (1,000 mg) QPM on days of radiation only (Mon-Fri) 11/08/15  Yes Ladell Pier, MD  carvedilol (COREG) 6.25 MG tablet Take 6.25 mg by mouth daily. 07/12/15 07/11/16 Yes Historical Provider, MD  DULoxetine (CYMBALTA) 30 MG capsule Take 1 capsule (30 mg total) by mouth daily. 12/18/15  Yes Owens Shark, NP  ferrous sulfate 325 (65 FE) MG tablet Take 325 mg by mouth daily with breakfast.   Yes Historical Provider, MD  furosemide (LASIX) 40 MG tablet Take 0.5 tablets (20 mg total) by mouth daily. 12/15/15  Yes Hayden Pedro, PA-C  megestrol (MEGACE) 400 MG/10ML suspension Take 5 mLs (200 mg total) by mouth 2 (two) times daily. 12/08/15  Yes Hayden Pedro, PA-C  oxyCODONE-acetaminophen (PERCOCET) 10-325 MG tablet Take 1 tablet by mouth every 4 (four) hours as needed for pain. 12/08/15  Yes Hayden Pedro, PA-C  pantoprazole (PROTONIX) 40 MG tablet Take 1 tablet (40 mg total) by mouth daily. 12/18/15 12/17/16 Yes Owens Shark, NP  potassium chloride SA (K-DUR,KLOR-CON) 20 MEQ tablet Take 1 tablet (20 mEq total) by mouth daily. 12/19/15  Yes Owens Shark, NP  psyllium (REGULOID) 0.52 g  capsule Take 0.52 g by mouth as directed.   Yes Historical Provider, MD  pyridOXINE (B-6) 50 MG tablet Take 50 mg by mouth daily.   Yes Historical Provider, MD  metoCLOPramide (REGLAN) 10 MG tablet Take 1 tablet (10 mg total) by mouth 4 (four) times daily. Patient not taking: Reported on 12/23/2015 12/19/15   Kyung Rudd, MD    Medications: . sodium chloride   Intravenous Once  . sodium chloride   Intravenous Once  . carvedilol  6.25 mg Oral Q breakfast  . DULoxetine  30 mg Oral Daily  . furosemide  20 mg Intravenous Once  . furosemide  20 mg Intravenous Once  . metoCLOPramide  10 mg Oral TID AC & HS  . ondansetron (ZOFRAN) IV  4 mg Intravenous Once  . piperacillin-tazobactam (ZOSYN)  IV  3.375 g Intravenous Q8H  . potassium chloride  10 mEq Intravenous Q1 Hr x 6  . sodium chloride flush  3 mL Intravenous Q12H  . tamsulosin  0.4 mg Oral Daily  . vancomycin  750 mg Intravenous Q12H    Hypertension  Assessment/Plan Pelvic abscess CT guided drainage of pelvic presacral abscess 12/24/15, IR Dr. 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 Stage IV rectal cancer with lung metastasis/lymphadenopathy  Prostate cancer Gleason 7  11/23/14 Ongoing radiation/Chemotherapy  VF arrest with ICD placement 10/19/14 EF 15% FEN:  Soft diet ID:  Day 3 Vancomycin/Zosyn DVT:  SCD only      Plan:  Continue antibiotics and drain.  Multiple medical issues per Medicine.     LOS: 2 days    Ercole Georg 12/25/2015 [phone]

## 2015-12-26 LAB — MAGNESIUM: Magnesium: 1.6 mg/dL — ABNORMAL LOW (ref 1.7–2.4)

## 2015-12-26 LAB — CBC
HEMATOCRIT: 26.5 % — AB (ref 39.0–52.0)
HEMOGLOBIN: 9.4 g/dL — AB (ref 13.0–17.0)
MCH: 32.5 pg (ref 26.0–34.0)
MCHC: 35.5 g/dL (ref 30.0–36.0)
MCV: 91.7 fL (ref 78.0–100.0)
Platelets: 237 10*3/uL (ref 150–400)
RBC: 2.89 MIL/uL — ABNORMAL LOW (ref 4.22–5.81)
RDW: 24.6 % — AB (ref 11.5–15.5)
WBC: 8 10*3/uL (ref 4.0–10.5)

## 2015-12-26 LAB — BASIC METABOLIC PANEL
Anion gap: 6 (ref 5–15)
BUN: 12 mg/dL (ref 6–20)
CALCIUM: 8 mg/dL — AB (ref 8.9–10.3)
CO2: 25 mmol/L (ref 22–32)
Chloride: 102 mmol/L (ref 101–111)
Creatinine, Ser: 0.73 mg/dL (ref 0.61–1.24)
GFR calc Af Amer: >60 mL/min (ref >60)
GLUCOSE: 114 mg/dL — AB (ref 65–99)
POTASSIUM: 3.3 mmol/L — AB (ref 3.5–5.1)
SODIUM: 133 mmol/L — AB (ref 135–145)

## 2015-12-26 LAB — HEMOGLOBIN AND HEMATOCRIT, BLOOD
HEMATOCRIT: 26 % — AB (ref 39.0–52.0)
HEMOGLOBIN: 9.3 g/dL — AB (ref 13.0–17.0)

## 2015-12-26 MED ORDER — POTASSIUM CHLORIDE CRYS ER 20 MEQ PO TBCR
40.0000 meq | EXTENDED_RELEASE_TABLET | Freq: Once | ORAL | Status: AC
  Administered 2015-12-26: 40 meq via ORAL
  Filled 2015-12-26: qty 2

## 2015-12-26 NOTE — Progress Notes (Signed)
PROGRESS NOTE  Michael Wade BZJ:[id number] DOB: [birth year] DOA: 12/23/2015 PCP: Pcp Not In System  HPI/Recap of past 24 hours:  Patient is a 70 year old male diagnosed with metastatic rectal cancer a year and a half ago as well as CAD status post V. fib arrest and ICD placement admitted on 7/1 with complaints of steadily worsening back and abdominal pain and in the emergency room patient was found to have a sacral abscess.  Patient admitted to hospitalist service and started on IV antibiotics. Palliative care consulted and after meeting with patient's sister, goals of care plan is for full aggressive interventions at this time. Seen by interventional radiology and underwent CT guided drainage with drain placement. Patient seen byoncology and has been undergoing radiation treatments, missing his most recent dose on 6/30 with 12 cycles left. There also recommending holding his further chemotherapy treatments until he is in better performance condition from this abscess. Seen by general surgery who are recommending possible loop colostomy for diversion, pending cardiac clearance.  Reportedly, patient had very poor ejection fraction of 15% based off of echocardiogram done 13 months ago. Repeat echocardiogram ordered which currently shows normal ejection fraction and only grade 1 diastolic dysfunction. 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 He is still at moderate risk for surgery given his poor nutritional status.  On morning of 7/3, patient's hemoglobin dropped to 5.9 requiring 2 units packed red blood cells. That afternoon, had extensive discussion with patient and he indicated that he did not want surgery, understanding that diverting colostomy would improve his backside wound and that he could not have chemotherapy until wound had healed. This led to follow discussion with family and patient with palliative care and patient made limited code.   He will continue other treatments including antibiotics. Further decisions on disposition to be made as patient progresses.  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 Surgery recommending diversion colostomy for now, pending cardiac clearance. Continue antibiotics. Awaiting cultures and sensitivities which will determine IV versus by mouth antibiotics.    CAD (coronary artery disease) with history of V. fib arrest and ICD placement: Echocardiogram read at outside hospital 13 months ago noted markedly depressed EF and echocardiogram today only notes mild diastolic dysfunction. BNP stable. Cardiology confirmed patient's much stronger ejection fraction.    Encounter for palliative care: Appreciate palliative cares assistance. Sister who is medical power of attorney initially wanted everything done. Once patient more alert, he is able to better clinically indicated his wishes and after discussion with myself and palliative care team, family met for goals of care and plan is for continuing some therapies, however no surgery and not to be too aggressive. Patient now limited code.   Severe Protein calorie malnutrition/underweight: Patient meets criteria in the context of chronic illness. Seen by nutrition. On ensure twice a day plus mighty shake daily  Acute blood loss anemia: May have been from hemoconcentration initially as well as initial wound: Now stable. Status post 2 units packed red blood cells hemoglobin today stable above 9  Code Status: Full code   Family Communication: Mother and sister at the bedside  Disposition Plan: Skilled nursing versus home health with PT. Awaiting cultures and sensitivities to determine antibiotic course.  Will have PT see  patient.  Consultants:  General surgery  Medical oncology  Interventional radiology  Palliative care  Cardiology   Procedures:  Status post CT-guided drainage and drain placement of rectal abscess done 7/2  Echocardiogram done 7/2: Grade 1 diastolic dysfunction, EF 61-44 percent   Antimicrobials:  IV Zosyn 7/1-present  IV vancomycin 7/1-present   DVT prophylaxis:  SCDs   Objective: Filed Vitals:   12/25/15 1525 12/25/15 2047 12/26/15 0445 12/26/15 1452  BP: 112/55 124/63 120/64 114/64  Pulse: 70 69 74 76  Temp: 97.8 F (36.6 C) 98.6 F (37 C) 98.3 F (36.8 C) 98.7 F (37.1 C)  TempSrc: Oral Oral Oral Oral  Resp: 18 18 18 18   Height:      Weight:      SpO2: 100% 100% 99% 99%    Intake/Output Summary (Last 24 hours) at 12/26/15 1534 Last data filed at 12/26/15 1300  Gross per 24 hour  Intake    480 ml  Output   4165 ml  Net  -3685 ml   Filed Weights   12/23/15 2353  Weight: 52.1 kg (114 lb 13.8 oz)    Exam: Little change from previous day  General:  Alert and oriented 2, fatigued, emaciated   Cardiovascular: Regular rhythm, S1-S2   Respiratory: Clear to auscultation bilaterally   Abdomen: Soft, non-distended, nontender, hypoactive bowel sounds   Musculoskeletal: No clubbing or cyanosis or edema   Psychiatry: Seems appropriate, no evidence of psychoses    Data Reviewed: CBC:  Recent Labs Lab 12/23/15 1605 12/24/15 0528 12/25/15 0445 12/25/15 1715 12/26/15 1015  WBC 24.6* 21.7* 11.1*  --  8.0  HGB 8.1* 7.1* 5.8* 9.3* 9.4*  HCT 22.5* 19.6* 16.0* 26.0* 26.5*  MCV 99.6 101.6* 101.3*  --  91.7  PLT 245 209 187  --  315   Basic Metabolic Panel:  Recent Labs Lab 12/23/15 1605 12/24/15 0528 12/25/15 0445 12/26/15 0435  NA 129* 133* 131* 133*  K 3.2* 3.2* 2.6* 3.3*  CL 97* 103 102 102  CO2 21* 22 22 25   GLUCOSE 148* 123* 111* 114*  BUN 26* 20 11 12   CREATININE 0.99 0.93 0.81 0.73  CALCIUM 8.7* 8.4* 7.7* 8.0*  MG  --   1.5*  --  1.6*  PHOS  --  2.1*  --   --    GFR: Estimated Creatinine Clearance: 65.1 mL/min (by C-G formula based on Cr of 0.73). Liver Function Tests:  Recent Labs Lab 12/23/15 1605 12/24/15 0528 12/25/15 0445  AST 17 13* 12*  ALT 13* 10* 9*  ALKPHOS 98 83 63  BILITOT 0.9 0.9 0.9  PROT 7.0 6.3* 5.2*  ALBUMIN 2.7* 2.4* 2.0*    Recent Labs Lab 12/23/15 1605  LIPASE 15   No results for input(s): AMMONIA in the last 168 hours. Coagulation Profile:  Recent Labs Lab 12/24/15 0528  INR 1.46   Cardiac Enzymes:  Recent Labs Lab 12/24/15 0048 12/24/15 0528 12/24/15 1430 12/24/15 2030  TROPONINI 0.32* 0.34* 0.33* 0.23*   BNP (last 3 results) No results for input(s): PROBNP in the last 8760 hours. HbA1C: No results for input(s): HGBA1C in the last 72 hours. CBG: No results for input(s): GLUCAP in the last 168 hours. Lipid Profile: No results for input(s): CHOL, HDL, LDLCALC, TRIG, CHOLHDL, LDLDIRECT in the last 72 hours. Thyroid Function Tests:  Recent Labs  12/24/15 0528  TSH 0.472   Anemia Panel:  Recent Labs  12/25/15 1715 12/25/15 1800  VITAMINB12  --  873  FOLATE 11.6  --  FERRITIN  --  809*  TIBC  --  193*  IRON  --  94  RETICCTPCT 3.0  --    Urine analysis:    Component Value Date/Time   COLORURINE YELLOW 12/23/2015 2020   APPEARANCEUR CLEAR 12/23/2015 2020   LABSPEC 1.019 12/23/2015 2020   PHURINE 6.0 12/23/2015 2020   GLUCOSEU NEGATIVE 12/23/2015 2020   HGBUR NEGATIVE 12/23/2015 2020   BILIRUBINUR NEGATIVE 12/23/2015 2020   KETONESUR NEGATIVE 12/23/2015 2020   PROTEINUR NEGATIVE 12/23/2015 2020   NITRITE NEGATIVE 12/23/2015 2020   LEUKOCYTESUR NEGATIVE 12/23/2015 2020   Sepsis Labs: @LABRCNTIP (procalcitonin:4,lacticidven:4)  ) Recent Results (from the past 240 hour(s))  Culture, blood (Routine X 2) w Reflex to ID Panel     Status: None (Preliminary result)   Collection Time: 12/23/15 11:38 PM  Result Value Ref Range Status    Specimen Description BLOOD RIGHT ARM  Final   Special Requests BOTTLES DRAWN AEROBIC AND ANAEROBIC 5CC  Final   Culture   Final    NO GROWTH 2 DAYS Performed at [facility]    Report Status PENDING  Incomplete  Culture, blood (Routine X 2) w Reflex to ID Panel     Status: None (Preliminary result)   Collection Time: 12/24/15 12:48 AM  Result Value Ref Range Status   Specimen Description BLOOD LEFT ARM  Final   Special Requests BOTTLES DRAWN AEROBIC AND ANAEROBIC 10CC  Final   Culture   Final    NO GROWTH 2 DAYS Performed at [facility]    Report Status PENDING  Incomplete  C difficile quick scan w PCR reflex     Status: None   Collection Time: 12/24/15  2:55 AM  Result Value Ref Range Status   C Diff antigen NEGATIVE NEGATIVE Final   C Diff toxin NEGATIVE NEGATIVE Final   C Diff interpretation Negative for toxigenic C. difficile  Final  Aerobic/Anaerobic Culture (surgical/deep wound)     Status: None (Preliminary result)   Collection Time: 12/24/15 12:50 PM  Result Value Ref Range Status   Specimen Description ABSCESS  Final   Special Requests CT LEFT TRANSGLUTEAL  Final   Gram Stain   Final    ABUNDANT WBC PRESENT,BOTH PMN AND MONONUCLEAR NO SQUAMOUS EPITHELIAL CELLS SEEN ABUNDANT GRAM NEGATIVE RODS ABUNDANT GRAM POSITIVE COCCI IN CLUSTERS FEW GRAM POSITIVE RODS Performed at [facility]    Culture   Final    CULTURE REINCUBATED FOR BETTER GROWTH NO ANAEROBES ISOLATED; CULTURE IN PROGRESS FOR 5 DAYS    Report Status PENDING  Incomplete      Studies: No results found.  Scheduled Meds: . sodium chloride   Intravenous Once  . carvedilol  6.25 mg Oral Q breakfast  . DULoxetine  30 mg Oral Daily  . feeding supplement (ENSURE ENLIVE)  237 mL Oral BID BM  . metoCLOPramide  10 mg Oral TID AC & HS  . ondansetron (ZOFRAN) IV  4 mg Intravenous Once  . piperacillin-tazobactam (ZOSYN)  IV  3.375 g Intravenous Q8H  . sodium chloride flush  3 mL  Intravenous Q12H  . tamsulosin  0.4 mg Oral Daily  . vancomycin  750 mg Intravenous Q12H    Continuous Infusions:    LOS: 3 days   Time spent: 15 minutes  Annita Brod, MD [facility] Pager [phone]  If 7PM-7AM, please contact night-coverage [email or web address] Password [facility] 12/26/2015, 3:34 PM

## 2015-12-26 NOTE — Progress Notes (Signed)
Pt not interested in diverting colostomy.  Will defer further management to Dr Burr Medico and Palliative care team.  Please call us if anything changes.  Rosario Adie, MD  [facility]

## 2015-12-27 ENCOUNTER — Ambulatory Visit: Payer: Medicare (Managed Care)

## 2015-12-27 ENCOUNTER — Encounter (HOSPITAL_COMMUNITY)
Admission: EM | Disposition: A | Discharge status: Hospice | Payer: Self-pay | Source: Home / Self Care | Attending: Internal Medicine

## 2015-12-27 ENCOUNTER — Encounter: Payer: 59 | Admitting: Nutrition

## 2015-12-27 ENCOUNTER — Telehealth: Payer: Self-pay | Admitting: *Deleted

## 2015-12-27 DIAGNOSIS — Z515 Encounter for palliative care: Secondary | ICD-10-CM

## 2015-12-27 DIAGNOSIS — R339 Retention of urine, unspecified: Secondary | ICD-10-CM

## 2015-12-27 DIAGNOSIS — R636 Underweight: Secondary | ICD-10-CM

## 2015-12-27 DIAGNOSIS — Z7189 Other specified counseling: Secondary | ICD-10-CM

## 2015-12-27 DIAGNOSIS — E860 Dehydration: Secondary | ICD-10-CM

## 2015-12-27 DIAGNOSIS — K611 Rectal abscess: Principal | ICD-10-CM

## 2015-12-27 DIAGNOSIS — I10 Essential (primary) hypertension: Secondary | ICD-10-CM

## 2015-12-27 LAB — TYPE AND SCREEN
ABO/RH(D): O POS
ANTIBODY SCREEN: NEGATIVE
UNIT DIVISION: 0
Unit division: 0
Unit division: 0

## 2015-12-27 LAB — CBC
HEMATOCRIT: 27.4 % — AB (ref 39.0–52.0)
Hemoglobin: 9.7 g/dL — ABNORMAL LOW (ref 13.0–17.0)
MCH: 32.8 pg (ref 26.0–34.0)
MCHC: 35.4 g/dL (ref 30.0–36.0)
MCV: 92.6 fL (ref 78.0–100.0)
PLATELETS: 285 10*3/uL (ref 150–400)
RBC: 2.96 MIL/uL — ABNORMAL LOW (ref 4.22–5.81)
RDW: 24 % — AB (ref 11.5–15.5)
WBC: 9.2 10*3/uL (ref 4.0–10.5)

## 2015-12-27 LAB — BASIC METABOLIC PANEL
Anion gap: 5 (ref 5–15)
BUN: 11 mg/dL (ref 6–20)
CHLORIDE: 100 mmol/L — AB (ref 101–111)
CO2: 26 mmol/L (ref 22–32)
CREATININE: 0.77 mg/dL (ref 0.61–1.24)
Calcium: 8.2 mg/dL — ABNORMAL LOW (ref 8.9–10.3)
GFR calc Af Amer: >60 mL/min (ref >60)
GFR calc non Af Amer: >60 mL/min (ref >60)
GLUCOSE: 100 mg/dL — AB (ref 65–99)
POTASSIUM: 3.9 mmol/L (ref 3.5–5.1)
SODIUM: 131 mmol/L — AB (ref 135–145)

## 2015-12-27 LAB — VANCOMYCIN, TROUGH: Vancomycin Tr: 19 ug/mL (ref 15–20)

## 2015-12-27 SURGERY — CREATION, COLOSTOMY, DIVERTING, LAPAROSCOPIC
Anesthesia: General

## 2015-12-27 MED ORDER — HYDROMORPHONE HCL 1 MG/ML IJ SOLN
0.5000 mg | INTRAMUSCULAR | Status: DC | PRN
  Administered 2015-12-27 – 2015-12-29 (×6): 0.5 mg via INTRAVENOUS
  Filled 2015-12-27 (×6): qty 1

## 2015-12-27 MED ORDER — ALUM & MAG HYDROXIDE-SIMETH 200-200-20 MG/5ML PO SUSP
30.0000 mL | ORAL | Status: DC | PRN
  Administered 2015-12-27 – 2015-12-28 (×3): 30 mL via ORAL
  Filled 2015-12-27 (×3): qty 30

## 2015-12-27 NOTE — Progress Notes (Signed)
Patient ID: Michael Wade, male   DOB: Jul 15, 1945, 70 y.o.   MRN: [id number]    Referring Physician(s): [place]  Supervising Physician: Arne Cleveland  Patient Status:  Inpatient  Chief Complaint:  Pelvic abscess  Subjective: Pt feeling about the same; asking when he can eat; still has some lower abd soreness; does not want surgery   Allergies: Review of patient's allergies indicates no known allergies.  Medications: Prior to Admission medications   Medication Sig Start Date End Date Taking? Authorizing Provider  capecitabine (XELODA) 500 MG tablet Take 3 tablets (1,500 mg) QAM 2 tabs (1,000 mg) QPM on days of radiation only (Mon-Fri) 11/08/15  Yes Ladell Pier, MD  carvedilol (COREG) 6.25 MG tablet Take 6.25 mg by mouth daily. 07/12/15 07/11/16 Yes Historical Provider, MD  DULoxetine (CYMBALTA) 30 MG capsule Take 1 capsule (30 mg total) by mouth daily. 12/18/15  Yes Owens Shark, NP  ferrous sulfate 325 (65 FE) MG tablet Take 325 mg by mouth daily with breakfast.   Yes Historical Provider, MD  furosemide (LASIX) 40 MG tablet Take 0.5 tablets (20 mg total) by mouth daily. 12/15/15  Yes Hayden Pedro, PA-C  megestrol (MEGACE) 400 MG/10ML suspension Take 5 mLs (200 mg total) by mouth 2 (two) times daily. 12/08/15  Yes Hayden Pedro, PA-C  oxyCODONE-acetaminophen (PERCOCET) 10-325 MG tablet Take 1 tablet by mouth every 4 (four) hours as needed for pain. 12/08/15  Yes Hayden Pedro, PA-C  pantoprazole (PROTONIX) 40 MG tablet Take 1 tablet (40 mg total) by mouth daily. 12/18/15 12/17/16 Yes Owens Shark, NP  potassium chloride SA (K-DUR,KLOR-CON) 20 MEQ tablet Take 1 tablet (20 mEq total) by mouth daily. 12/19/15  Yes Owens Shark, NP  psyllium (REGULOID) 0.52 g capsule Take 0.52 g by mouth as directed.   Yes Historical Provider, MD  pyridOXINE (B-6) 50 MG tablet Take 50 mg by mouth daily.   Yes Historical Provider, MD  metoCLOPramide (REGLAN) 10 MG tablet Take 1  tablet (10 mg total) by mouth 4 (four) times daily. Patient not taking: Reported on 12/23/2015 12/19/15   Kyung Rudd, MD     Vital Signs: BP 122/71 mmHg  Pulse 77  Temp(Src) 98 F (36.7 C) (Oral)  Resp 16  Ht 5\' 11"  (1.803 m)  Wt 114 lb 13.8 oz (52.1 kg)  BMI 16.03 kg/m2  SpO2 100%  Physical Exam left TG/pelvic drain intact, dressing clean and dry, mildly tender, output 100+ cc's feculent fluid; cx's pend  Imaging: Ct Abdomen Pelvis W Contrast  12/23/2015  CLINICAL DATA:  Abdominal pain and diarrhea for a few months. 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 EXAM: CT ABDOMEN AND PELVIS WITH CONTRAST TECHNIQUE: Multidetector CT imaging of the abdomen and pelvis was performed using the standard protocol following bolus administration of intravenous contrast. CONTRAST:  25mL ISOVUE-300 IOPAMIDOL (ISOVUE-300) INJECTION 61% COMPARISON:  Outside CT from 11/03/2015.  Outside CT from 08/06/2014 FINDINGS: Lower chest:  Centrilobular emphysema noted in the lung bases. Hepatobiliary: Scattered tiny hypodensities in the liver parenchyma are stable. No enhancing lesion is identified within the liver parenchyma. There is no evidence for gallstones, gallbladder wall thickening, or pericholecystic fluid. Mild intrahepatic biliary duct dilatation has progressed slightly in the interval. Common bile duct in the head of the pancreas measures 8 mm in diameter today compared to 6 mm on 11/03/2015. Pancreas: Slight prominence of the main pancreatic duct, stable. Spleen: No splenomegaly.  No focal mass lesion. Adrenals/Urinary Tract: No adrenal nodule or mass. Atrophic left kidney. No evidence for enhancing renal lesion with evidence of decreased perfusion of the left kidney. No evidence for hydroureter. Bladder is markedly distended. Stomach/Bowel: Stomach is nondistended. No gastric wall thickening. No evidence of outlet obstruction. Duodenum is  normally positioned as is the ligament of Treitz. Small bowel loops are nondilated. Terminal ileum unremarkable. The appendix is not visualized, but there is no edema or inflammation in the region of the cecum. Colon is diffusely distended and stool-filled from the cecum to the rectum. Vascular/Lymphatic: There is abdominal aortic atherosclerosis without aneurysm. Abdominal aorta measures up to 2.9 cm in diameter. No evidence for gastrohepatic or hepatoduodenal ligament lymphadenopathy. No abdominal retroperitoneal lymphadenopathy on the current study. No evidence for pelvic sidewall lymphadenopathy. Reproductive: The prostate gland and seminal vesicles have normal imaging features. Other: A large collection of fluid, debris, and gas is identified in the presacral space, between the rectum and the sacrum. The main component of this complex process measures 13.5 cm in coronal diameter by 9 cm in craniocaudal diameter by 4.5 cm in AP diameter. There is extension of this process posteriorly in the right gluteus musculature, behind the right acetabulum. Gas dissects around to the right side of the rectum, displacing the rectum to the left. There is associated wall thickening in the rectum and perirectal edema/ inflammation. Musculoskeletal: Bone windows reveal no worrisome lytic or sclerotic osseous lesions. IMPRESSION: 1. Large complex collection of gas, debris, and fluid in the presacral space compatible with abscess. This dissects down around to the right of the rectum and laterally into the right gluteus musculature behind the right acetabulum. No direct communication to the rectal lumen is evident, but features are probably related to rectal perforation in this patient with a history of rectal carcinoma. 2. Markedly distended urinary bladder with the dome of the bladder cranial to the umbilicus. Imaging features are compatible with urinary retention. 3. Atrophic left kidney. 4. Scattered low-density liver lesions  stable comparing back to the oldest outside imaging study of 08/06/2014. Imaging findings of potential clinical significance: Emphysema. [id number]) Aortic Atherosclerosis (ICD10-170.0) Electronically Signed   By: Misty Stanley M.D.   On: 12/23/2015 19:36   Dg Abd Acute W/chest  12/23/2015  CLINICAL DATA:  Abdominal pain for a few months. EXAM: DG ABDOMEN ACUTE W/ 1V CHEST COMPARISON:  No comparison studies available. FINDINGS: The lungs are clear wiithout focal pneumonia, edema, pneumothorax or pleural effusion. The cardiopericardial silhouette is within normal limits for size. Left-sided pacer/AICD noted. A right Port-A-Cath is visualized with distal tip position overlying the distal SVC. Old posterior right rib fractures noted. Right-side-up decubitus film shows no evidence for intraperitoneal free air. Supine view the abdomen shows no gaseous bowel dilatation suggest obstruction. Prominent stool volume is seen along the length of the colon. Bones are diffusely demineralized. IMPRESSION: 1. No acute cardiopulmonary findings. 2. No evidence for bowel perforation or obstruction. 3. Large colonic stool volume. Imaging features could be compatible with clinical constipation. Electronically Signed   By: Misty Stanley M.D.   On: 12/23/2015 16:53   Ct Image Guided Drainage Percut Cath  Peritoneal Retroperit  12/24/2015  CLINICAL DATA:  Rectal carcinoma, post perforation, with large presacral abscess EXAM: CT GUIDED DRAINAGE OF PELVIC PRESACRAL ABSCESS ANESTHESIA/SEDATION: Intravenous Fentanyl and Versed were administered as conscious sedation during continuous monitoring of the patient's level of consciousness and physiological / cardiorespiratory status by the radiology RN, with a total moderate sedation  time of 12 minutes. PROCEDURE: The procedure, risks, benefits, and alternatives were explained to the patient. Questions regarding the procedure were encouraged and answered. The patient understands and consents  to the procedure. Patient placed prone. Select axial scans through the pelvis obtained. An appropriate skin entry site was determined and marked. The operative field was prepped with chlorhexidinein a sterile fashion, and a sterile drape was applied covering the operative field. A sterile gown and sterile gloves were used for the procedure. Local anesthesia was provided with 1% Lidocaine. Under CT fluoroscopic guidance, a 19 gauge percutaneous entry needle was advanced into the collection from a rib left trans gluteal approach. Purulent material spontaneously returned through the needle lobe. An Amplatz wire advanced easily within the collection, its position confirmed on CT. Tract was dilated to facilitate placement of a 12 French pigtail catheter, placed centrally within the collection. 60 mL purulent material were aspirated, sent for routine Gram stain and culture. Catheter secured externally with 0 Prolene suture and stat lock, and placed to gravity drain bag. The patient tolerated the procedure well. COMPLICATIONS: None immediate FINDINGS: Complex presacral gas and fluid collecting dissecting into the deep right gluteal muscular planes again identified. CT-guided abscess drain catheter placement from a left-sided approach was performed. IMPRESSION: 1. Technically successful CT-guided presacral pelvic abscess drain catheter placement Electronically Signed   By: Lucrezia Europe M.D.   On: 12/24/2015 14:06    Labs:  CBC:  Recent Labs  12/24/15 0528 12/25/15 0445 12/25/15 1715 12/26/15 1015 12/27/15 0450  WBC 21.7* 11.1*  --  8.0 9.2  HGB 7.1* 5.8* 9.3* 9.4* 9.7*  HCT 19.6* 16.0* 26.0* 26.5* 27.4*  PLT 209 187  --  237 285    COAGS:  Recent Labs  12/24/15 0528  INR 1.46    BMP:  Recent Labs  12/24/15 0528 12/25/15 0445 12/26/15 0435 12/27/15 0450  NA 133* 131* 133* 131*  K 3.2* 2.6* 3.3* 3.9  CL 103 102 102 100*  CO2 22 22 25 26   GLUCOSE 123* 111* 114* 100*  BUN 20 11 12 11     CALCIUM 8.4* 7.7* 8.0* 8.2*  CREATININE 0.93 0.81 0.73 0.77  GFRNONAA >60 >60 >60 >60  GFRAA >60 >60 >60 >60    LIVER FUNCTION TESTS:  Recent Labs  12/18/15 1349 12/23/15 1605 12/24/15 0528 12/25/15 0445  BILITOT 0.70 0.9 0.9 0.9  AST 11 17 13* 12*  ALT 10 13* 10* 9*  ALKPHOS 117 98 83 63  PROT 7.1 7.0 6.3* 5.2*  ALBUMIN 2.6* 2.7* 2.4* 2.0*    Assessment and Plan: S/p presacral abscess drain 7/2 (hx stage IV rectal ca); AF; WBC/creat nl; hgb stable; check final drain fluid cx's; cont drain irrigation; check f/u CT within week of placement ; will need injection study before removal; pall care on board   Electronically Signed: D. Rowe Robert 12/27/2015, 1:57 PM   I spent a total of 15 minutes at the the patient's bedside AND on the patient's hospital floor or unit, greater than 50% of which was counseling/coordinating care for pelvic abscess drain

## 2015-12-27 NOTE — Telephone Encounter (Signed)
Patient has an appt. with Dr. Tresa Moore on 01/08/16 @ 8:45 am, patient aware of this appt.

## 2015-12-27 NOTE — Progress Notes (Signed)
PROGRESS NOTE  Michael Wade XBM:[id number] DOB: 1946/06/20 DOA: 12/23/2015 PCP: Pcp Not In System  HPI/Recap of past 24 hours:  Patient is a 70 year old male diagnosed with metastatic rectal cancer a year and a half ago as well as CAD status post V. fib arrest and ICD placement admitted on 7/1 with complaints of steadily worsening back and abdominal pain and in the emergency room patient was found to have a sacral abscess.  Patient admitted to hospitalist service and started on IV antibiotics. Palliative care consulted and after meeting with patient's sister, goals of care plan is for full aggressive interventions at this time. Seen by interventional radiology and underwent CT guided drainage with drain placement. Patient seen byoncology and has been undergoing radiation treatments, missing his most recent dose on 6/30 with 12 cycles left. There also recommending holding his further chemotherapy treatments until he is in better performance condition from this abscess. Seen by general surgery who are recommending possible loop colostomy for diversion, pending cardiac clearance.  Reportedly, patient had very poor ejection fraction of 15% based off of echocardiogram done 13 months ago. Repeat echocardiogram ordered which currently shows normal ejection fraction and only grade 1 diastolic dysfunction. 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 He is still at moderate risk for surgery given his poor nutritional status.  On morning of 7/3, patient's hemoglobin dropped to 5.9 requiring 2 units packed red blood cells. That afternoon, had extensive discussion with patient and he indicated that he did not want surgery, understanding that diverting colostomy would improve his backside wound and that he could not have chemotherapy until wound had healed. This led to follow discussion with family and patient with palliative care and patient made limited code.   He will continue other treatments including antibiotics. Further decisions on disposition to be made as patient progresses.  7/5: Pt wants to start ambulating.  He still adamant that he wants NO more surgery.  Reports abdominal pain today.    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 Surgery recommending diversion colostomy for now, pending cardiac clearance. Continue antibiotics. Awaiting cultures and sensitivities which will determine IV versus by mouth antibiotics.  Blood cultures no growth to date 7/5.     CAD (coronary artery disease) with history of V. fib arrest and ICD placement: Echocardiogram read at outside hospital 13 months ago noted markedly depressed EF and echocardiogram today only notes mild diastolic dysfunction. BNP stable. Cardiology confirmed patient's much stronger ejection fraction.    Encounter for palliative care: Appreciate palliative cares assistance. Sister who is medical power of attorney initially wanted everything done. Once patient more alert, he is able to better clinically indicated his wishes and after discussion with myself and palliative care team, family met for goals of care and plan is for continuing some therapies, however no surgery and not to be too aggressive. Patient now limited code.   Severe Protein calorie malnutrition/underweight: Patient meets criteria in the context of chronic illness. Seen by nutrition. On ensure twice a day plus mighty shake daily  Acute blood loss anemia: May have been from hemoconcentration initially as well as initial wound: Now stable. Status post 2 units packed red blood cells hemoglobin today stable above 9  Code Status: Full code   Family Communication:  Mother and sister at the bedside  Disposition  Plan: Skilled nursing versus home health with PT. Awaiting cultures and sensitivities to determine antibiotic course.  Will have PT see patient.   Consultants:  General surgery  Medical oncology  Interventional radiology  Palliative care  Cardiology   Procedures:  Status post CT-guided drainage and drain placement of rectal abscess done 7/2  Echocardiogram done 7/2: Grade 1 diastolic dysfunction, EF 96-75 percent   Antimicrobials:  IV Zosyn 7/1-present  IV vancomycin 7/1-present   DVT prophylaxis:  SCDs   Objective: Filed Vitals:   12/26/15 0445 12/26/15 1452 12/26/15 2035 12/27/15 0511  BP: 120/64 114/64 123/70 122/71  Pulse: 74 76 74 77  Temp: 98.3 F (36.8 C) 98.7 F (37.1 C) 98.8 F (37.1 C) 98 F (36.7 C)  TempSrc: Oral Oral Oral Oral  Resp: 18 18 17 16   Height:      Weight:      SpO2: 99% 99% 100% 100%    Intake/Output Summary (Last 24 hours) at 12/27/15 0958 Last data filed at 12/27/15 0511  Gross per 24 hour  Intake    530 ml  Output   2200 ml  Net  -1670 ml   Filed Weights   12/23/15 2353  Weight: 114 lb 13.8 oz (52.1 kg)    Exam:   General:  Alert and oriented 2, fatigued, emaciated   Cardiovascular: Regular rhythm, S1-S2   Respiratory: Clear to auscultation bilaterally   Abdomen: Soft, non-distended, mild RLQ TTP, no guarding, bowel sounds heard  Musculoskeletal: No clubbing or cyanosis or edema   Psychiatry: Seems appropriate, no evidence of psychosis   Data Reviewed: CBC:  Recent Labs Lab 12/23/15 1605 12/24/15 0528 12/25/15 0445 12/25/15 1715 12/26/15 1015 12/27/15 0450  WBC 24.6* 21.7* 11.1*  --  8.0 9.2  HGB 8.1* 7.1* 5.8* 9.3* 9.4* 9.7*  HCT 22.5* 19.6* 16.0* 26.0* 26.5* 27.4*  MCV 99.6 101.6* 101.3*  --  91.7 92.6  PLT 245 209 187  --  237 916   Basic Metabolic Panel:  Recent Labs Lab 12/23/15 1605 12/24/15 0528 12/25/15 0445 12/26/15 0435 12/27/15 0450  NA 129* 133* 131* 133* 131*  K 3.2* 3.2*  2.6* 3.3* 3.9  CL 97* 103 102 102 100*  CO2 21* 22 22 25 26   GLUCOSE 148* 123* 111* 114* 100*  BUN 26* 20 11 12 11   CREATININE 0.99 0.93 0.81 0.73 0.77  CALCIUM 8.7* 8.4* 7.7* 8.0* 8.2*  MG  --  1.5*  --  1.6*  --   PHOS  --  2.1*  --   --   --    GFR: Estimated Creatinine Clearance: 65.1 mL/min (by C-G formula based on Cr of 0.77). Liver Function Tests:  Recent Labs Lab 12/23/15 1605 12/24/15 0528 12/25/15 0445  AST 17 13* 12*  ALT 13* 10* 9*  ALKPHOS 98 83 63  BILITOT 0.9 0.9 0.9  PROT 7.0 6.3* 5.2*  ALBUMIN 2.7* 2.4* 2.0*    Recent Labs Lab 12/23/15 1605  LIPASE 15   No results for input(s): AMMONIA in the last 168 hours. Coagulation Profile:  Recent Labs Lab 12/24/15 0528  INR 1.46   Cardiac Enzymes:  Recent Labs Lab 12/24/15 0048 12/24/15 0528 12/24/15 1430 12/24/15 2030  TROPONINI 0.32* 0.34* 0.33* 0.23*   BNP (last 3 results) No results for input(s): PROBNP in the last 8760 hours. HbA1C: No results for input(s): HGBA1C in the last 72 hours. CBG: No results for input(s): GLUCAP in  the last 168 hours. Lipid Profile: No results for input(s): CHOL, HDL, LDLCALC, TRIG, CHOLHDL, LDLDIRECT in the last 72 hours. Thyroid Function Tests: No results for input(s): TSH, T4TOTAL, FREET4, T3FREE, THYROIDAB in the last 72 hours. Anemia Panel:  Recent Labs  12/25/15 1715 12/25/15 1800  VITAMINB12  --  873  FOLATE 11.6  --   FERRITIN  --  809*  TIBC  --  193*  IRON  --  94  RETICCTPCT 3.0  --    Urine analysis:    Component Value Date/Time   COLORURINE YELLOW 12/23/2015 2020   APPEARANCEUR CLEAR 12/23/2015 2020   LABSPEC 1.019 12/23/2015 2020   PHURINE 6.0 12/23/2015 2020   GLUCOSEU NEGATIVE 12/23/2015 2020   HGBUR NEGATIVE 12/23/2015 2020   [place] NEGATIVE 12/23/2015 2020   KETONESUR NEGATIVE 12/23/2015 2020   PROTEINUR NEGATIVE 12/23/2015 2020   NITRITE NEGATIVE 12/23/2015 2020   LEUKOCYTESUR NEGATIVE 12/23/2015 2020    Recent  Results (from the past 240 hour(s))  Culture, blood (Routine X 2) w Reflex to ID Panel     Status: None (Preliminary result)   Collection Time: 12/23/15 11:38 PM  Result Value Ref Range Status   Specimen Description BLOOD RIGHT ARM  Final   Special Requests BOTTLES DRAWN AEROBIC AND ANAEROBIC 5CC  Final   Culture   Final    NO GROWTH 2 DAYS Performed at [facility]    Report Status PENDING  Incomplete  Culture, blood (Routine X 2) w Reflex to ID Panel     Status: None (Preliminary result)   Collection Time: 12/24/15 12:48 AM  Result Value Ref Range Status   Specimen Description BLOOD LEFT ARM  Final   Special Requests BOTTLES DRAWN AEROBIC AND ANAEROBIC 10CC  Final   Culture   Final    NO GROWTH 2 DAYS Performed at [facility]    Report Status PENDING  Incomplete  C difficile quick scan w PCR reflex     Status: None   Collection Time: 12/24/15  2:55 AM  Result Value Ref Range Status   C Diff antigen NEGATIVE NEGATIVE Final   C Diff toxin NEGATIVE NEGATIVE Final   C Diff interpretation Negative for toxigenic C. difficile  Final  Aerobic/Anaerobic Culture (surgical/deep wound)     Status: None (Preliminary result)   Collection Time: 12/24/15 12:50 PM  Result Value Ref Range Status   Specimen Description ABSCESS  Final   Special Requests CT LEFT TRANSGLUTEAL  Final   Gram Stain   Final    ABUNDANT WBC PRESENT,BOTH PMN AND MONONUCLEAR NO SQUAMOUS EPITHELIAL CELLS SEEN ABUNDANT GRAM NEGATIVE RODS ABUNDANT GRAM POSITIVE COCCI IN CLUSTERS FEW GRAM POSITIVE RODS Performed at [facility]    Culture   Final    CULTURE REINCUBATED FOR BETTER GROWTH NO ANAEROBES ISOLATED; CULTURE IN PROGRESS FOR 5 DAYS    Report Status PENDING  Incomplete   Studies: No results found.  Scheduled Meds: . sodium chloride   Intravenous Once  . carvedilol  6.25 mg Oral Q breakfast  . DULoxetine  30 mg Oral Daily  . feeding supplement (ENSURE ENLIVE)  237 mL Oral BID BM  .  metoCLOPramide  10 mg Oral TID AC & HS  . ondansetron (ZOFRAN) IV  4 mg Intravenous Once  . piperacillin-tazobactam (ZOSYN)  IV  3.375 g Intravenous Q8H  . sodium chloride flush  3 mL Intravenous Q12H  . tamsulosin  0.4 mg Oral Daily  . vancomycin  750  mg Intravenous Q12H    Continuous Infusions:    LOS: 4 days   Time spent: 18 minutes  Irwin Brakeman, MD [facility] Pager [phone]  If 7PM-7AM, please contact night-coverage [email or web address] Password [email or web address] 12/27/2015, 9:58 AM

## 2015-12-27 NOTE — Progress Notes (Signed)
PHARMACIST - PHYSICIAN COMMUNICATION CONCERNING: Vancomycin  69 yoM on vancomycin and zosyn for large pelvic presacral abscess.  Please see note written earlier by Lindell Spar, PharmD for more details.   Vancomycin trough tonight is therapeutic at 35mcg/ml (goal 15-20).    RECOMMENDATION: Continue vancomycin IV 750mg  q12h. F/u cultures, renal fxn, clinical course  Ralene Bathe, PharmD, BCPS 12/27/2015, 9:57 PM  Pager: [phone]

## 2015-12-27 NOTE — Evaluation (Addendum)
Physical Therapy Evaluation Patient Details Name: Michael Wade MRN: [id number] DOB: 06-09-1946 Today's Date: 12/27/2015   History of Present Illness  Patient is a 70 year old male diagnosed with metastatic rectal cancer a year and a half ago as well as CAD status post V. fib arrest and ICD placement admitted on 7/1 with complaints of steadily worsening back and abdominal pain and in the emergency room patient was found to have a sacral abscess.  Clinical Impression  Pt admitted with above diagnosis. Pt currently with functional limitations due to the deficits listed below (see PT Problem List). * Pt will benefit from skilled PT to increase their independence and safety with mobility to allow discharge to the venue listed below.   Pt weak, deconditioned, mobilizing with min assist, recommend pt amb with nsg staff also to improved activity tolerance; recommend HHPT, pt mother reports she and her dtr assist as needed at home     Follow Up Recommendations Home health PT;Supervision for mobility/OOB    Equipment Recommendations  W/C, has RW    Recommendations for Other Services       Precautions / Restrictions Precautions Precautions: Fall Restrictions Weight Bearing Restrictions: No      Mobility  Bed Mobility Overal bed mobility: Needs Assistance Bed Mobility: Supine to Sit     Supine to sit: Min assist     General bed mobility comments: assist with trunk; incr time  Transfers Overall transfer level: Needs assistance Equipment used: Rolling walker (2 wheeled) Transfers: Sit to/from [facility] Sit to Stand: Min assist Stand pivot transfers: Min assist       General transfer comment: cues for safety  Ambulation/Gait             General Gait Details: deferred d/t lunch arrived  Stairs            Wheelchair Mobility    Modified Rankin (Stroke Patients Only)       Balance Overall balance assessment: Needs assistance   Sitting  balance-Leahy Scale: Fair       Standing balance-Leahy Scale: Fair                               Pertinent Vitals/Pain Pain Assessment: No/denies pain    Home Living Family/patient expects to be discharged to:: Private residence Living Arrangements:  (lives with mother and sister) Available Help at Discharge: Family Type of Home: House Home Access: Stairs to enter   Technical brewer of Steps: 3 Home Layout: One level Home Equipment: None Additional Comments: pt mother has a walker     Prior Function Level of Independence: Independent               Hand Dominance        Extremity/Trunk Assessment   Upper Extremity Assessment: Generalized weakness           Lower Extremity Assessment: Generalized weakness         Communication   Communication: No difficulties  Cognition Arousal/Alertness: Awake/alert Behavior During Therapy: WFL for tasks assessed/performed Overall Cognitive Status: Within Functional Limits for tasks assessed                      General Comments      Exercises        Assessment/Plan    PT Assessment Patient needs continued PT services  PT Diagnosis Difficulty walking   PT Problem List Decreased strength;Decreased activity tolerance;Decreased  balance;Decreased mobility;Decreased knowledge of use of DME  PT Treatment Interventions DME instruction;Gait training;Functional mobility training;Therapeutic activities;Therapeutic exercise;Patient/family education   PT Goals (Current goals can be found in the Care Plan section) Acute Rehab PT Goals Patient Stated Goal: home PT Goal Formulation: With patient Time For Goal Achievement: 01/03/16 Potential to Achieve Goals: Good    Frequency Min 3X/week   Barriers to discharge        Co-evaluation               End of Session Equipment Utilized During Treatment: Gait belt Activity Tolerance: Patient tolerated treatment well Patient left: in  chair;with call bell/phone within reach;with chair alarm set Nurse Communication: Mobility status         Time: 1435-1456 PT Time Calculation (min) (ACUTE ONLY): 21 min   Charges:   PT Evaluation $PT Eval Low Complexity: 1 Procedure     PT G Codes:        Akiya Morr 01-16-16, 2:56 PM

## 2015-12-27 NOTE — Progress Notes (Signed)
Pharmacy Antibiotic Note  Michael Wade is a 70 y.o. male admitted on 12/23/2015 with large pelvic presacral abscess s/p CT-guided drainage on 12/24/15. PMH includes colorectal cancer, HTN & CAD.  H/O epidural abscess in the past requiring IV abx.  Currently on Vancomycin and Zosyn per pharmacy dosing.   Today, 12/27/2015: WBC now WNL, remains afebrile, SCr WNL  Plan:  Continue Zosyn 3.375g IV q8h (infuse over 4 hours).  Continue Vancomycin 750 mg IV q12h.  Check Vancomycin trough level prior to dose this PM. Goal trough level 15-20 mcg/mL.  Will need "long course" of IV abx for abscess per Onc.  F/u renal function, cultures/sensitivities, clinical course.    Height: 5\' 11"  (180.3 cm) Weight: 114 lb 13.8 oz (52.1 kg) IBW/kg (Calculated) : 75.3  Temp (24hrs), Avg:98.5 F (36.9 C), Min:98 F (36.7 C), Max:98.8 F (37.1 C)   Recent Labs Lab 12/23/15 1605 12/24/15 0528 12/25/15 0445 12/26/15 0435 12/26/15 1015 12/27/15 0450  WBC 24.6* 21.7* 11.1*  --  8.0 9.2  CREATININE 0.99 0.93 0.81 0.73  --  0.77    Estimated Creatinine Clearance: 65.1 mL/min (by C-G formula based on Cr of 0.77).    No Known Allergies  Antimicrobials this admission: 7/1 Zosyn >>   7/2 Vanc >>    Dose adjustments this admission: 7/5 2130 VT: pending  Microbiology results: 7/1 BCx: NGTD 7/1 CDiff: -/- 7/2 perirectal abscess: abundant GNR, GPCs in clusters, few GPRs; culture reincubated for better growth, no anaerobes isolated   Thank you for allowing pharmacy to be a part of this patient's care.   Lindell Spar, PharmD, BCPS Pager: [phone] 12/27/2015 11:28 AM

## 2015-12-28 ENCOUNTER — Ambulatory Visit: Payer: Medicare (Managed Care)

## 2015-12-28 DIAGNOSIS — D509 Iron deficiency anemia, unspecified: Secondary | ICD-10-CM

## 2015-12-28 LAB — BASIC METABOLIC PANEL
ANION GAP: 4 — AB (ref 5–15)
BUN: 11 mg/dL (ref 6–20)
CHLORIDE: 102 mmol/L (ref 101–111)
CO2: 27 mmol/L (ref 22–32)
Calcium: 8.4 mg/dL — ABNORMAL LOW (ref 8.9–10.3)
Creatinine, Ser: 0.69 mg/dL (ref 0.61–1.24)
GFR calc Af Amer: >60 mL/min (ref >60)
GLUCOSE: 101 mg/dL — AB (ref 65–99)
POTASSIUM: 4.5 mmol/L (ref 3.5–5.1)
Sodium: 133 mmol/L — ABNORMAL LOW (ref 135–145)

## 2015-12-28 LAB — CBC
HCT: 29 % — ABNORMAL LOW (ref 39.0–52.0)
Hemoglobin: 10 g/dL — ABNORMAL LOW (ref 13.0–17.0)
MCH: 32.4 pg (ref 26.0–34.0)
MCHC: 34.5 g/dL (ref 30.0–36.0)
MCV: 93.9 fL (ref 78.0–100.0)
PLATELETS: 275 10*3/uL (ref 150–400)
RBC: 3.09 MIL/uL — ABNORMAL LOW (ref 4.22–5.81)
RDW: 23.6 % — ABNORMAL HIGH (ref 11.5–15.5)
WBC: 9.1 10*3/uL (ref 4.0–10.5)

## 2015-12-28 MED ORDER — AMOXICILLIN-POT CLAVULANATE 875-125 MG PO TABS
1.0000 | ORAL_TABLET | Freq: Two times a day (BID) | ORAL | Status: DC
  Administered 2015-12-28 – 2015-12-29 (×2): 1 via ORAL
  Filled 2015-12-28 (×2): qty 1

## 2015-12-28 MED ORDER — DOXYCYCLINE HYCLATE 100 MG PO TABS
100.0000 mg | ORAL_TABLET | Freq: Two times a day (BID) | ORAL | Status: DC
  Administered 2015-12-28 – 2015-12-29 (×3): 100 mg via ORAL
  Filled 2015-12-28 (×3): qty 1

## 2015-12-28 NOTE — Progress Notes (Signed)
Patient ID: Michael Wade, male   DOB: 10-Mar-1946, 70 y.o.   MRN: [id number]    Referring Physician(s): Dr. Leighton Wade  Supervising Physician: Michael Wade  Patient Status: inpt  Chief Complaint: Perirectal abscess  Subjective: Patient complaining of abdominal pain.  Allergies: Review of patient's allergies indicates no known allergies.  Medications: Prior to Admission medications   Medication Sig Start Date End Date Taking? Authorizing Provider  capecitabine (XELODA) 500 MG tablet Take 3 tablets (1,500 mg) QAM 2 tabs (1,000 mg) QPM on days of radiation only (Mon-Fri) 11/08/15  Yes Michael Pier, MD  carvedilol (COREG) 6.25 MG tablet Take 6.25 mg by mouth daily. 07/12/15 07/11/16 Yes Historical Provider, MD  DULoxetine (CYMBALTA) 30 MG capsule Take 1 capsule (30 mg total) by mouth daily. 12/18/15  Yes Michael Shark, NP  ferrous sulfate 325 (65 FE) MG tablet Take 325 mg by mouth daily with breakfast.   Yes Historical Provider, MD  furosemide (LASIX) 40 MG tablet Take 0.5 tablets (20 mg total) by mouth daily. 12/15/15  Yes Michael Pedro, PA-C  megestrol (MEGACE) 400 MG/10ML suspension Take 5 mLs (200 mg total) by mouth 2 (two) times daily. 12/08/15  Yes Michael Pedro, PA-C  oxyCODONE-acetaminophen (PERCOCET) 10-325 MG tablet Take 1 tablet by mouth every 4 (four) hours as needed for pain. 12/08/15  Yes Michael Pedro, PA-C  pantoprazole (PROTONIX) 40 MG tablet Take 1 tablet (40 mg total) by mouth daily. 12/18/15 12/17/16 Yes Michael Shark, NP  potassium chloride SA (K-DUR,KLOR-CON) 20 MEQ tablet Take 1 tablet (20 mEq total) by mouth daily. 12/19/15  Yes Michael Shark, NP  psyllium (REGULOID) 0.52 g capsule Take 0.52 g by mouth as directed.   Yes Historical Provider, MD  pyridOXINE (B-6) 50 MG tablet Take 50 mg by mouth daily.   Yes Historical Provider, MD  metoCLOPramide (REGLAN) 10 MG tablet Take 1 tablet (10 mg total) by mouth 4 (four) times daily. Patient not  taking: Reported on 12/23/2015 12/19/15   Michael Rudd, MD    Vital Signs: BP 106/62 mmHg  Pulse 89  Temp(Src) 98.2 F (36.8 C) (Oral)  Resp 17  Ht 5\' 11"  (1.803 m)  Wt 114 lb 13.8 oz (52.1 kg)  BMI 16.03 kg/m2  SpO2 100%  Physical Exam: Buttock: drain transgluteal on left side.  Site is clean/dry/intact. Drain with tan cloudy output.  200cc/24hr  Imaging: Ct Image Guided Drainage Percut Cath  Peritoneal Retroperit  12/24/2015  CLINICAL DATA:  Rectal carcinoma, post perforation, with large presacral abscess EXAM: CT GUIDED DRAINAGE OF PELVIC PRESACRAL ABSCESS ANESTHESIA/SEDATION: Intravenous Fentanyl and Versed were administered as conscious sedation during continuous monitoring of the patient's level of consciousness and physiological / cardiorespiratory status by the radiology RN, with a total moderate sedation time of 12 minutes. PROCEDURE: The procedure, risks, benefits, and alternatives were explained to the patient. Questions regarding the procedure were encouraged and answered. The patient understands and consents to the procedure. Patient placed prone. Select axial scans through the pelvis obtained. An appropriate skin entry site was determined and marked. The operative field was prepped with chlorhexidinein a sterile fashion, and a sterile drape was applied covering the operative field. A sterile gown and sterile gloves were used for the procedure. Local anesthesia was provided with 1% Lidocaine. Under CT fluoroscopic guidance, a 19 gauge percutaneous entry needle was advanced into the collection from a rib left trans gluteal approach. Purulent material spontaneously returned through the needle lobe. An Amplatz wire  advanced easily within the collection, its position confirmed on CT. Tract was dilated to facilitate placement of a 12 French pigtail catheter, placed centrally within the collection. 60 mL purulent material were aspirated, sent for routine Gram stain and culture. Catheter secured  externally with 0 Prolene suture and stat lock, and placed to gravity drain bag. The patient tolerated the procedure well. COMPLICATIONS: None immediate FINDINGS: Complex presacral gas and fluid collecting dissecting into the deep right gluteal muscular planes again identified. CT-guided abscess drain catheter placement from a left-sided approach was performed. IMPRESSION: 1. Technically successful CT-guided presacral pelvic abscess drain catheter placement Electronically Signed   By: Michael Wade M.D.   On: 12/24/2015 14:06    Labs:  CBC:  Recent Labs  12/25/15 0445 12/25/15 1715 12/26/15 1015 12/27/15 0450 12/28/15 0400  WBC 11.1*  --  8.0 9.2 9.1  HGB 5.8* 9.3* 9.4* 9.7* 10.0*  HCT 16.0* 26.0* 26.5* 27.4* 29.0*  PLT 187  --  237 285 275    COAGS:  Recent Labs  12/24/15 0528  INR 1.46    BMP:  Recent Labs  12/25/15 0445 12/26/15 0435 12/27/15 0450 12/28/15 0400  NA 131* 133* 131* 133*  K 2.6* 3.3* 3.9 4.5  CL 102 102 100* 102  CO2 22 25 26 27   GLUCOSE 111* 114* 100* 101*  BUN 11 12 11 11   CALCIUM 7.7* 8.0* 8.2* 8.4*  CREATININE 0.81 0.73 0.77 0.69  GFRNONAA >60 >60 >60 >60  GFRAA >60 >60 >60 >60    LIVER FUNCTION TESTS:  Recent Labs  12/18/15 1349 12/23/15 1605 12/24/15 0528 12/25/15 0445  BILITOT 0.70 0.9 0.9 0.9  AST 11 17 13* 12*  ALT 10 13* 10* 9*  ALKPHOS 117 98 83 63  PROT 7.1 7.0 6.3* 5.2*  ALBUMIN 2.6* 2.7* 2.4* 2.0*    Assessment and Plan: 1. Perirectal abscess, s/p drain placement on 7/2 - [name] -cont drain for now as he has 200cc of output in 24hrs. -Repeat CT scan when output less than 10-15cc/day -patient can follow up in the drain clinic after discharge if DC with drain in place. -will follow  Electronically Signed: Willowdean Wade E 12/28/2015, 12:48 PM   I spent a total of 15 Minutes at the the patient's bedside AND on the patient's hospital floor or unit, greater than 50% of which was counseling/coordinating care for  perirectal abscess

## 2015-12-28 NOTE — Evaluation (Signed)
Occupational Therapy Evaluation Patient Details Name: Michael Wade MRN: [id number] DOB: 13-Apr-1946 Today's Date: 12/28/2015    History of Present Illness Patient is a 70 year old male diagnosed with metastatic rectal cancer a year and a half ago as well as CAD status post V. fib arrest and ICD placement admitted on 7/1 with complaints of steadily worsening back and abdominal pain and in the emergency room patient was found to have a sacral abscess.   Clinical Impression   Pt was admitted for the above.  He recently moved here and has had assistance as needed for adls from family.  Pt is familiar with AE.  He will benefit from continued OT to increase strength and endurance for adls.  Goals are for supervision level in acute setting    Follow Up Recommendations  Supervision/Assistance - 24 hour    Equipment Recommendations  None recommended by OT    Recommendations for Other Services       Precautions / Restrictions Precautions Precautions: Fall Restrictions Weight Bearing Restrictions: No      Mobility Bed Mobility           Sit to supine: Min assist   General bed mobility comments: for legs/lines  Transfers   Equipment used: Rolling walker (2 wheeled)   Sit to Stand: Min guard Stand pivot transfers: Min guard       General transfer comment: close supervision for safety    Balance                                            ADL Overall ADL's : Needs assistance/impaired                         Toilet Transfer: Min guard;Ambulation;RW;Grab bars;Comfort height toilet             General ADL Comments: pt is able to perform UB adls with setup.  He is familiar with AE and can have family assist also, as needed. Pt's L foot has dressing on it.  Pt shaky but no LOB when ambulating.  Pt verbalizes understanding of AE.  Educated on short bursts of activity to build endurance and rest breaks     Vision     Perception      Praxis      Pertinent Vitals/Pain Pain Assessment: No/denies pain     Hand Dominance     Extremity/Trunk Assessment Upper Extremity Assessment Upper Extremity Assessment: Generalized weakness           Communication Communication Communication: No difficulties   Cognition Arousal/Alertness: Awake/alert Behavior During Therapy: WFL for tasks assessed/performed Overall Cognitive Status: Within Functional Limits for tasks assessed                     General Comments       Exercises       Shoulder Instructions      Home Living Family/patient expects to be discharged to:: Private residence Living Arrangements: Parent Available Help at Discharge:  (mother and sister) Type of Home: House                Bathroom Toilet: Handicapped height         Additional Comments: mother has all DME, which pt can use:  AE, shower seat, high commode      Prior Functioning/Environment Level of  Independence: Needs assistance        Comments: family assists as needed.      OT Diagnosis: Generalized weakness   OT Problem List: Decreased strength;Decreased activity tolerance;Impaired balance (sitting and/or standing)   OT Treatment/Interventions: Self-care/ADL training;DME and/or AE instruction;Patient/family education;Balance training    OT Goals(Current goals can be found in the care plan section) Acute Rehab OT Goals Patient Stated Goal: home OT Goal Formulation: With patient Time For Goal Achievement: 01/04/16 Potential to Achieve Goals: Good ADL Goals Pt Will Perform Grooming: with supervision;standing Pt Will Transfer to Toilet: with supervision;ambulating;bedside commode;grab bars Additional ADL Goal #1: pt will initiate at least one rest break for energy conservation Additional ADL Goal #2: pt will be independent with written HEP  OT Frequency: Min 2X/week   Barriers to D/C:            Co-evaluation              End of Session     Activity Tolerance: Patient limited by fatigue Patient left: in bed;with call bell/phone within reach;with bed alarm set (4 rails at pt's request)   Time: [id number] OT Time Calculation (min): 16 min Charges:  OT General Charges $OT Visit: 1 Procedure OT Evaluation $OT Eval Low Complexity: 1 Procedure G-Codes:    Kiegan Macaraeg 2016/01/20, 12:16 PM  Lesle Chris, OTR/L [phone] 01/20/2016

## 2015-12-28 NOTE — Progress Notes (Addendum)
Daily Progress Note   Patient Name: Michael Wade       Date: 12/28/2015 DOB: [birth year]  Age: 70 y.o. MRN#: [id number] Attending Physician: Murlean Iba, MD Primary Care Physician: Pcp Not In System Admit Date: 12/23/2015  Reason for Consultation/Follow-up: Establishing goals of care  Subjective: Met with patient at bedside today.  He has stated that he wants to continue antibiotics and other treatments, but no surgical interventions.   Currently await results of culture to determine appropriate abx selection for discharge.  I stopped by to continue discussion with him regarding his long-term goals of care.  We discussed that the goal of any medical interventions that he has is to add time and quality to his life. We also talked about the fact that he would like to limit interventions that are not in line with this goal. I introduced the concept of a MOST form and we went through this together today. He reports he wants to look into overnight tonight and is happy to meet tomorrow to continue conversation regarding goals moving forward.  Length of Stay: 5  Current Medications: Scheduled Meds:  . sodium chloride   Intravenous Once  . amoxicillin-clavulanate  1 tablet Oral Q12H  . doxycycline  100 mg Oral Q12H  . DULoxetine  30 mg Oral Daily  . feeding supplement (ENSURE ENLIVE)  237 mL Oral BID BM  . metoCLOPramide  10 mg Oral TID AC & HS  . ondansetron (ZOFRAN) IV  4 mg Intravenous Once  . sodium chloride flush  3 mL Intravenous Q12H  . tamsulosin  0.4 mg Oral Daily    Continuous Infusions:    PRN Meds: acetaminophen **OR** acetaminophen, alum & mag hydroxide-simeth, HYDROmorphone (DILAUDID) injection, ondansetron **OR** ondansetron (ZOFRAN) IV, oxyCODONE-acetaminophen **AND**  oxyCODONE, sodium chloride flush  Physical Exam  Constitutional: He is oriented to person, place, and time.  Frail  HENT:  Head: Normocephalic and atraumatic.  Eyes: EOM are normal.  Cardiovascular: Normal rate.   Pulmonary/Chest: Effort normal.  Neurological: He is alert and oriented to person, place, and time.  Psychiatric: He has a normal mood and affect. Judgment normal.            Vital Signs: BP 114/71 mmHg  Pulse 95  Temp(Src) 98.3 F (36.8 C) (Oral)  Resp 18  Ht 5'  11" (1.803 m)  Wt 52.1 kg (114 lb 13.8 oz)  BMI 16.03 kg/m2  SpO2 100% SpO2: SpO2: 100 % O2 Device: O2 Device: Not Delivered O2 Flow Rate: O2 Flow Rate (L/min): 2 L/min  Intake/output summary:   Intake/Output Summary (Last 24 hours) at 12/28/15 1915 Last data filed at 12/28/15 1750  Gross per 24 hour  Intake    240 ml  Output   1350 ml  Net  -1110 ml   LBM: Last BM Date: 12/28/15 Baseline Weight: Weight: 52.1 kg (114 lb 13.8 oz) Most recent weight: Weight: 52.1 kg (114 lb 13.8 oz)       Palliative Assessment/Data: 30%   Flowsheet Rows        Most Recent Value   Intake Tab    Referral Department  Hospitalist   Unit at Time of Referral  Oncology Unit   Palliative Care Primary Diagnosis  Cancer   Date Notified  12/23/15   Palliative Care Type  New Palliative care   Reason for referral  Clarify Goals of Care   Date of Admission  12/23/15   Date first seen by Palliative Care  12/24/15   # of days Palliative referral response time  1 Day(s)   # of days IP prior to Palliative referral  0   Clinical Assessment    Palliative Performance Scale Score  30%   Pain Max last 24 hours  5   Pain Min Last 24 hours  4   Psychosocial & Spiritual Assessment    Palliative Care Outcomes    Patient/Family meeting held?  Yes   Who was at the meeting?  sister Camila Li       Patient Active Problem List   Diagnosis Date Noted  . Underweight   . Protein-calorie malnutrition, severe ([place])   .  Abdominal abscess ([place])   . Encounter for palliative care   . Goals of care, counseling/discussion   . Abscess, rectum 12/23/2015  . CAD (coronary artery disease) 12/23/2015  . Hypertension 12/23/2015  . Urinary retention 12/23/2015  . Abscess, perirectal 12/23/2015  . Dehydration 12/23/2015  . Port catheter in place 12/05/2015  . Rectal cancer metastasized to lung ([place]) 10/24/2015  . Prostate cancer [facility]) 10/24/2015    Palliative Care Assessment & Plan   Patient Profile:   Assessment/Recommendations/Plan:  - No surgery per patient's wishes -Continue antibiotics -Continue to monitor progress, functional status, pain management -Introduced concept of MOST form today. He would like to review this evening and is agreeable to f/u tomorrow to continue conversation on long term goals. -F/U tomorrow  Goals of Care and Additional Recommendations:  Limitations on Scope of Treatment: No Surgical Procedures  Code Status:    Code Status Orders        Start     Ordered   12/25/15 1412  Limited resuscitation (code)   Continuous    Question Answer Comment  In the event of cardiac or respiratory ARREST: Initiate Code Blue, Call Rapid Response Yes   In the event of cardiac or respiratory ARREST: Perform CPR Yes   In the event of cardiac or respiratory ARREST: Perform Intubation/Mechanical Ventilation No   In the event of cardiac or respiratory ARREST: Use NIPPV/BiPAp only if indicated Yes   In the event of cardiac or respiratory ARREST: Administer ACLS medications if indicated Yes   In the event of cardiac or respiratory ARREST: Perform Defibrillation or Cardioversion if indicated Yes      12/25/15 1412  Code Status History    Date Active Date Inactive Code Status Order ID Comments User Context   12/23/2015 11:37 PM 12/25/2015  2:12 PM Full Code [id number]  Toy Baker, MD ED       Prognosis:  Unable to determine short term, but he has multiple life limiting illnesses  with a anticipated life expectancy of less than 6 months if these follow the natural course. He should qualify for hospice support if so desired any point in the future.   Discharge Planning:  To Be Determined   Care plan was discussed with patient  Thank you for allowing the Palliative Medicine Team to assist in the care of this patient.   Time In: 1120 Time Out: 1150 Total Time 30 Prolonged Time Billed No      Greater than 50%  of this time was spent counseling and coordinating care related to the above assessment and plan.  Micheline Rough, MD [place] Team [phone] Please contact Palliative Medicine Team phone at [phone] for questions and concerns.

## 2015-12-28 NOTE — Progress Notes (Signed)
Physical Therapy Treatment Patient Details Name: Michael Wade MRN: [id number] DOB: 06/17/1946 Today's Date: 12/28/2015    History of Present Illness Patient is a 70 year old male diagnosed with metastatic rectal cancer a year and a half ago as well as CAD status post V. fib arrest and ICD placement admitted on 7/1 with complaints of steadily worsening back and abdominal pain and in the emergency room patient was found to have a sacral abscess.    PT Comments    Pt participates well but session was limited by bowel incontinence/leaking. Deferred ambulation for this reason. Continue to recommend HHPT follow up at home.   Follow Up Recommendations  Home health PT;Supervision for mobility/OOB     Equipment Recommendations  Wheelchair;Wheelchair cushion (possibly for longer distances)    Recommendations for Other Services       Precautions / Restrictions Precautions Precautions: Fall Precaution Comments: drain gluteal area; bowel incontinence Restrictions Weight Bearing Restrictions: No    Mobility  Bed Mobility   Bed Mobility: Supine to Sit;Sit to Supine     Supine to sit: Min guard Sit to supine: Min guard   General bed mobility comments: close guard for safety.   Transfers Overall transfer level: Needs assistance Equipment used: Rolling walker (2 wheeled) Transfers: Sit to/from Stand Sit to Stand: Min guard Stand pivot transfers: Min guard       General transfer comment: x2. close supervision for safety  Ambulation/Gait             General Gait Details: took a few steps forward and along side of bed. Unable to venture out of room due to bowel incontinence/leaking   Stairs            Wheelchair Mobility    Modified Rankin (Stroke Patients Only)       Balance                                    Cognition Arousal/Alertness: Awake/alert Behavior During Therapy: WFL for tasks assessed/performed Overall Cognitive Status: Within  Functional Limits for tasks assessed                      Exercises      General Comments        Pertinent Vitals/Pain Pain Assessment: No/denies pain    Home Living Family/patient expects to be discharged to:: Private residence Living Arrangements: Parent Available Help at Discharge:  (mother and sister) Type of Home: House         Additional Comments: mother has all DME, which pt can use:  AE, shower seat, high commode    Prior Function Level of Independence: Needs assistance      Comments: family assists as needed.     PT Goals (current goals can now be found in the care plan section) Acute Rehab PT Goals Patient Stated Goal: home Progress towards PT goals: Progressing toward goals    Frequency  Min 3X/week    PT Plan Current plan remains appropriate    Co-evaluation             End of Session   Activity Tolerance: Patient tolerated treatment well Patient left: in bed;with call bell/phone within reach;with bed alarm set;with family/visitor present     Time: [id number] PT Time Calculation (min) (ACUTE ONLY): 20 min  Charges:  $Therapeutic Activity: 8-22 mins  G Codes:      Weston Anna, MPT Pager: [phone]

## 2015-12-28 NOTE — Progress Notes (Signed)
IP PROGRESS NOTE  Subjective:   Michael Wade was admitted 12/23/2015 with abdomen and back pain. A CT of the abdomen and pelvis revealed a large fluid/gas collection between the rectum and sacrum. The bladder was markedly distended. He was admitted for further evaluation. He underwent placement of a percutaneous drain on 12/24/2015 by interventional radiology. He was seen in consultation by Dr. Marcello Moores. She recommended a diverting colostomy. Mr. Maka declines a colostomy. A Foley catheter is in place.  He reports feeling better at present.  Objective: Vital signs in last 24 hours: Blood pressure 114/71, pulse 95, temperature 98.3 F (36.8 C), temperature source Oral, resp. rate 18, height 5\' 11"  (1.803 m), weight 114 lb 13.8 oz (52.1 kg), SpO2 100 %.  Intake/Output from previous day: 07/05 0701 - 07/06 0700 In: 710 [P.O.:480; I.V.:30; IV Piggyback:200] Out: 1275 [Urine:1075; Drains:200]  Physical Exam:  HEENT: No thrush or ulcers Lungs: Clear bilaterally Cardiac: Regular rate and rhythm Abdomen: Soft and nontender, no mass, percutaneous drain at the left buttock Extremities: No leg edema  Portacath/PICC-without erythema   Lab Results:  Recent Labs  12/27/15 0450 12/28/15 0400  WBC 9.2 9.1  HGB 9.7* 10.0*  HCT 27.4* 29.0*  PLT 285 275    BMET  Recent Labs  12/27/15 0450 12/28/15 0400  NA 131* 133*  K 3.9 4.5  CL 100* 102  CO2 26 27  GLUCOSE 100* 101*  BUN 11 11  CREATININE 0.77 0.69  CALCIUM 8.2* 8.4*     Medications: I have reviewed the patient's current medications.  Assessment/Plan:  1. Rectal cancer, stage IV, a lung metastasis and Metastatic lymphadenopathy on initial staging), status post A rectal biopsy 08/07/2015 , CEA 2.8 on 09/22/2014  colonoscopy 08/06/2014 confirmed a mass extending from 7-18 cm from the anus  MRI 08/19/2014 revealed a circumferential mass at the rectum, no definite extension beyond the serosal margin, no perirectal  adenopathy  PET scan 09/30/2014 with an FDG avid upper rectal mass, extension into adjacent mesorectal fat versus mesorectal lymphadenopathy, FDG avid left retroperitoneal lymph nodes and an FDG avid Right upper lung groundglass nodule measuring 1.9 x 1.1 cm.  CT-guided biopsy of the right lung nodule 10/12/2014 confirmed metastatic colorectal cancer  treatment with FOLFOX/Avastin for 12 cycles completed December 2016, oxaliplatin deleted after cycle 10 secondary to neuropathy  restaging PET scan 02/23/2015 after 6 cycles revealed local regional response and the right upper lobe nodule had almost completely resolved  PET scan 06/12/2015 after cycle 12 revealed continued improvement in the rectal mass, hypermetabolic Retroperitoneal lymphadenopathy no longer seen, hyper metabolic activity at right lung nodule had resolved  one cycle of Xeloda January 2017  CTs chest, abdomen, and pelvis 11/03/2015-clustered peripheral right upper lobe nodules, no other evidence of metastatic disease, rectal mass with large colonic stool burden, 1.6 cm presacral rim-enhancing fluid collection  Initiation of radiation/Xeloda 11/30/2015, last treated on 12/21/2015   2. Prostate cancer, PSA 11.3 09/05/2014   Prostate biopsy 09/05/2014 confirmed a Gleason 7 tumor  maintained on every three-month Lupron starting 11/23/2014   3. Iron deficiency anemia  4. 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 History of CAD, EF 15%  6. Hypertension 7. 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 Family history of colon and pancreas cancer 9.  Pelvic abscess confirmed on CT 12/23/2015-percutaneous drain placed 12/24/2015 10. Urinary retention, status post placement of a Foley  catheter, potentially related to prostate  cancer  Mr. Brinkley is admitted with a pelvic abscess and urinary retention. There is a pelvic drain in place. He has a small metastatic tumor burden,, but his prognosis is poor in the absence of surgery for the rectal cancer. He states that he has decided to decline surgery.  I recommend discontinuing radiation and Xeloda. He is a candidate for a [facility] referral. He agrees to a hospice referral. I will be glad to follow him as an outpatient with the [facility] program.  LOS: 5 days   Betsy Coder, MD   12/28/2015, 5:14 PM

## 2015-12-28 NOTE — Progress Notes (Signed)
PROGRESS NOTE  Michael Wade YOV:[id number] DOB: [birth year] DOA: 12/23/2015 PCP: Pcp Not In System  HPI/Recap of past 24 hours: Patient is a 70 year old male diagnosed with metastatic rectal cancer a year and a half ago as well as CAD status post V. fib arrest and ICD placement admitted on 7/1 with complaints of steadily worsening back and abdominal pain and in the emergency room patient was found to have a sacral abscess.  Patient admitted to hospitalist service and started on IV antibiotics. Palliative care consulted and after meeting with patient's sister, goals of care plan is for full aggressive interventions at this time. Seen by interventional radiology and underwent CT guided drainage with drain placement. Patient seen byoncology and has been undergoing radiation treatments, missing his most recent dose on 6/30 with 12 cycles left. There also recommending holding his further chemotherapy treatments until he is in better performance condition from this abscess. Seen by general surgery who are recommending possible loop colostomy for diversion, pending cardiac clearance.  Reportedly, patient had very poor ejection fraction of 15% based off of echocardiogram done 13 months ago. Repeat echocardiogram ordered which currently shows normal ejection fraction and only grade 1 diastolic dysfunction. 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 He is still at moderate risk for surgery given his poor nutritional status.  On morning of 7/3, patient's hemoglobin dropped to 5.9 requiring 2 units packed red blood cells. That afternoon, had extensive discussion with patient and he indicated that he did not want surgery, understanding that diverting colostomy would improve his backside wound and that he could not have chemotherapy until wound had healed. This led to follow discussion with family and patient with palliative care and patient made limited code.   He will continue other treatments including antibiotics. Further decisions on disposition to be made as patient progresses.  7/5: Pt wants to start ambulating.  He still adamant that he wants NO more surgery.  Reports abdominal pain today.   7/6: I spoke with Dr. Linus Salmons (ID) - starting augmentin/doxycycline in preparation for discharge.   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 Surgery recommending diversion colostomy but patient declined. Switch to oral antibiotics today augmentin and doxycycline.   Blood cultures no growth to date 7/5.   CAD (coronary artery disease) with history of V. fib arrest and ICD placement: Echocardiogram read at outside hospital 13 months ago noted markedly depressed EF and echocardiogram today only notes mild diastolic dysfunction. BNP stable. Cardiology confirmed patient's much stronger ejection fraction.  Encounter for palliative care: Appreciate palliative cares assistance. Sister who is medical power of attorney initially wanted everything done. Once patient more alert, he is able to better clinically indicated his wishes and after discussion with myself and palliative care team, family met for goals of care and plan is for continuing some therapies, however no surgery and not to be too aggressive. Patient now limited code.   Severe Protein calorie malnutrition/underweight: Patient meets criteria in the context of chronic illness. Seen by nutrition. On ensure twice a day plus mighty shake daily  Acute blood loss anemia: May have been from hemoconcentration initially as well as initial wound: Now stable. Status post 2 units packed red blood cells hemoglobin today stable above 9  Code Status: Full code  Family Communication: Mother and sister at  the bedside  Disposition Plan: home health with PT. Home tomorrow.    Consultants:  General surgery  Medical oncology  Interventional radiology  Palliative care  Cardiology   Procedures:  Status post CT-guided drainage and drain placement of rectal abscess done 7/2  Echocardiogram done 7/2: Grade 1 diastolic dysfunction, EF 84-66 percent   Antimicrobials:  IV Zosyn 7/1-present  IV vancomycin 7/1-present   DVT prophylaxis:  SCDs   Objective: Filed Vitals:   12/27/15 0511 12/27/15 1609 12/27/15 2131 12/28/15 0535  BP: 122/71 107/66 123/77 106/62  Pulse: 77 89 83 89  Temp: 98 F (36.7 C) 98.3 F (36.8 C) 98.2 F (36.8 C) 98.2 F (36.8 C)  TempSrc: Oral Oral Oral Oral  Resp: 16 18 17 17   Height:      Weight:      SpO2: 100% 100% 100% 100%    Intake/Output Summary (Last 24 hours) at 12/28/15 1240 Last data filed at 12/28/15 0853  Gross per 24 hour  Intake    290 ml  Output    900 ml  Net   -610 ml   Filed Weights   12/23/15 2353  Weight: 114 lb 13.8 oz (52.1 kg)    Exam:   General:  Alert and oriented 2, fatigued, emaciated   Cardiovascular: Regular rhythm, S1-S2   Respiratory: Clear to auscultation bilaterally   Abdomen: Soft, non-distended, mild RLQ TTP, no guarding, bowel sounds heard  GU: brown liquid seen from drain.  Musculoskeletal: No clubbing or cyanosis or edema   Psychiatry: Seems appropriate, no evidence of psychosis   Data Reviewed: CBC:  Recent Labs Lab 12/24/15 0528 12/25/15 0445 12/25/15 1715 12/26/15 1015 12/27/15 0450 12/28/15 0400  WBC 21.7* 11.1*  --  8.0 9.2 9.1  HGB 7.1* 5.8* 9.3* 9.4* 9.7* 10.0*  HCT 19.6* 16.0* 26.0* 26.5* 27.4* 29.0*  MCV 101.6* 101.3*  --  91.7 92.6 93.9  PLT 209 187  --  237 285 599   Basic Metabolic Panel:  Recent Labs Lab 12/24/15 0528 12/25/15 0445 12/26/15 0435 12/27/15 0450 12/28/15 0400  NA 133* 131* 133* 131* 133*  K 3.2* 2.6* 3.3* 3.9 4.5  CL 103 102 102 100* 102    CO2 22 22 25 26 27   GLUCOSE 123* 111* 114* 100* 101*  BUN 20 11 12 11 11   CREATININE 0.93 0.81 0.73 0.77 0.69  CALCIUM 8.4* 7.7* 8.0* 8.2* 8.4*  MG 1.5*  --  1.6*  --   --   PHOS 2.1*  --   --   --   --    GFR: Estimated Creatinine Clearance: 65.1 mL/min (by C-G formula based on Cr of 0.69). Liver Function Tests:  Recent Labs Lab 12/23/15 1605 12/24/15 0528 12/25/15 0445  AST 17 13* 12*  ALT 13* 10* 9*  ALKPHOS 98 83 63  BILITOT 0.9 0.9 0.9  PROT 7.0 6.3* 5.2*  ALBUMIN 2.7* 2.4* 2.0*    Recent Labs Lab 12/23/15 1605  LIPASE 15   No results for input(s): AMMONIA in the last 168 hours. Coagulation Profile:  Recent Labs Lab 12/24/15 0528  INR 1.46   Cardiac Enzymes:  Recent Labs Lab 12/24/15 0048 12/24/15 0528 12/24/15 1430 12/24/15 2030  TROPONINI 0.32* 0.34* 0.33* 0.23*   BNP (last 3 results) No results for input(s): PROBNP in the last 8760 hours. HbA1C: No results for input(s): HGBA1C in the last 72 hours. CBG: No results for input(s): GLUCAP in the last  168 hours. Lipid Profile: No results for input(s): CHOL, HDL, LDLCALC, TRIG, CHOLHDL, LDLDIRECT in the last 72 hours. Thyroid Function Tests: No results for input(s): TSH, T4TOTAL, FREET4, T3FREE, THYROIDAB in the last 72 hours. Anemia Panel:  Recent Labs  12/25/15 1715 12/25/15 1800  VITAMINB12  --  873  FOLATE 11.6  --   FERRITIN  --  809*  TIBC  --  193*  IRON  --  94  RETICCTPCT 3.0  --    Urine analysis:    Component Value Date/Time   COLORURINE YELLOW 12/23/2015 2020   APPEARANCEUR CLEAR 12/23/2015 2020   LABSPEC 1.019 12/23/2015 2020   PHURINE 6.0 12/23/2015 2020   GLUCOSEU NEGATIVE 12/23/2015 2020   HGBUR NEGATIVE 12/23/2015 2020   [place] NEGATIVE 12/23/2015 2020   KETONESUR NEGATIVE 12/23/2015 2020   PROTEINUR NEGATIVE 12/23/2015 2020   NITRITE NEGATIVE 12/23/2015 2020   LEUKOCYTESUR NEGATIVE 12/23/2015 2020    Recent Results (from the past 240 hour(s))  Culture,  blood (Routine X 2) w Reflex to ID Panel     Status: None (Preliminary result)   Collection Time: 12/23/15 11:38 PM  Result Value Ref Range Status   Specimen Description BLOOD RIGHT ARM  Final   Special Requests BOTTLES DRAWN AEROBIC AND ANAEROBIC 5CC  Final   Culture   Final    NO GROWTH 3 DAYS Performed at [facility]    Report Status PENDING  Incomplete  Culture, blood (Routine X 2) w Reflex to ID Panel     Status: None (Preliminary result)   Collection Time: 12/24/15 12:48 AM  Result Value Ref Range Status   Specimen Description BLOOD LEFT ARM  Final   Special Requests BOTTLES DRAWN AEROBIC AND ANAEROBIC 10CC  Final   Culture   Final    NO GROWTH 3 DAYS Performed at [facility]    Report Status PENDING  Incomplete  C difficile quick scan w PCR reflex     Status: None   Collection Time: 12/24/15  2:55 AM  Result Value Ref Range Status   C Diff antigen NEGATIVE NEGATIVE Final   C Diff toxin NEGATIVE NEGATIVE Final   C Diff interpretation Negative for toxigenic C. difficile  Final  Aerobic/Anaerobic Culture (surgical/deep wound)     Status: None (Preliminary result)   Collection Time: 12/24/15 12:50 PM  Result Value Ref Range Status   Specimen Description ABSCESS  Final   Special Requests CT LEFT TRANSGLUTEAL  Final   Gram Stain   Final    ABUNDANT WBC PRESENT,BOTH PMN AND MONONUCLEAR NO SQUAMOUS EPITHELIAL CELLS SEEN ABUNDANT GRAM NEGATIVE RODS ABUNDANT GRAM POSITIVE COCCI IN CLUSTERS FEW GRAM POSITIVE RODS Performed at [facility]    Culture   Final    CULTURE REINCUBATED FOR BETTER GROWTH NO ANAEROBES ISOLATED; CULTURE IN PROGRESS FOR 5 DAYS    Report Status PENDING  Incomplete   Studies: No results found.  Scheduled Meds: . sodium chloride   Intravenous Once  . DULoxetine  30 mg Oral Daily  . feeding supplement (ENSURE ENLIVE)  237 mL Oral BID BM  . metoCLOPramide  10 mg Oral TID AC & HS  . ondansetron (ZOFRAN) IV  4 mg Intravenous  Once  . piperacillin-tazobactam (ZOSYN)  IV  3.375 g Intravenous Q8H  . sodium chloride flush  3 mL Intravenous Q12H  . tamsulosin  0.4 mg Oral Daily  . vancomycin  750 mg Intravenous Q12H   Continuous Infusions:    LOS:  5 days   Time spent: 20 minutes  Irwin Brakeman, MD [facility] Pager [phone]  If 7PM-7AM, please contact night-coverage [email or web address] Password [email or web address] 12/28/2015, 12:40 PM

## 2015-12-29 ENCOUNTER — Telehealth: Payer: Self-pay | Admitting: *Deleted

## 2015-12-29 ENCOUNTER — Ambulatory Visit
Admit: 2015-12-29 | Discharge: 2015-12-29 | Disposition: A | Discharge status: Home / Self Care | Payer: 59 | Attending: Radiation Oncology | Admitting: Radiation Oncology

## 2015-12-29 ENCOUNTER — Ambulatory Visit: Payer: Medicare (Managed Care)

## 2015-12-29 ENCOUNTER — Ambulatory Visit: Admit: 2015-12-29 | Payer: 59 | Admitting: Radiation Oncology

## 2015-12-29 ENCOUNTER — Other Ambulatory Visit: Payer: Self-pay | Admitting: Radiology

## 2015-12-29 DIAGNOSIS — K611 Rectal abscess: Secondary | ICD-10-CM

## 2015-12-29 LAB — AEROBIC/ANAEROBIC CULTURE (SURGICAL/DEEP WOUND)

## 2015-12-29 LAB — CBC
HEMATOCRIT: 26.2 % — AB (ref 39.0–52.0)
Hemoglobin: 9.2 g/dL — ABNORMAL LOW (ref 13.0–17.0)
MCH: 32.6 pg (ref 26.0–34.0)
MCHC: 35.1 g/dL (ref 30.0–36.0)
MCV: 92.9 fL (ref 78.0–100.0)
PLATELETS: 259 10*3/uL (ref 150–400)
RBC: 2.82 MIL/uL — ABNORMAL LOW (ref 4.22–5.81)
RDW: 23.2 % — AB (ref 11.5–15.5)
WBC: 10.8 10*3/uL — ABNORMAL HIGH (ref 4.0–10.5)

## 2015-12-29 MED ORDER — AMOXICILLIN-POT CLAVULANATE 875-125 MG PO TABS: 1.0000 | ORAL_TABLET | Freq: Two times a day (BID) | ORAL | Status: AC

## 2015-12-29 MED ORDER — DOXYCYCLINE HYCLATE 100 MG PO TABS: 100.0000 mg | ORAL_TABLET | Freq: Two times a day (BID) | ORAL | Status: AC

## 2015-12-29 MED ORDER — ENSURE ENLIVE PO LIQD: 237.0000 mL | Freq: Two times a day (BID) | ORAL | Status: AC

## 2015-12-29 MED ORDER — ACETAMINOPHEN 325 MG PO TABS: 650.0000 mg | ORAL_TABLET | Freq: Four times a day (QID) | ORAL | Status: AC | PRN

## 2015-12-29 MED ORDER — TAMSULOSIN HCL 0.4 MG PO CAPS: 0.4000 mg | ORAL_CAPSULE | Freq: Every day | ORAL | Status: AC

## 2015-12-29 MED ORDER — OXYCODONE-ACETAMINOPHEN 10-325 MG PO TABS
1.0000 | ORAL_TABLET | ORAL | Status: DC | PRN
Start: 2015-12-29 — End: 2016-01-18

## 2015-12-29 MED ORDER — HEPARIN SOD (PORK) LOCK FLUSH 100 UNIT/ML IV SOLN
500.0000 [IU] | INTRAVENOUS | Status: AC | PRN
  Administered 2015-12-29: 500 [IU]

## 2015-12-29 NOTE — Telephone Encounter (Signed)
Message from [place], Hospice Admissions: Pt is being discharged home with hospice, requesting Dr. Benay Spice to be his attending. Is it OK to initiate hospice care orders? Reviewed with Dr. Benay Spice: MD will be attending and OK for hospice orders.  Order called to Amy.

## 2015-12-29 NOTE — Discharge Instructions (Signed)
Abscess An abscess (boil or furuncle) is an infected area on or under the skin. This area is filled with yellowish-white fluid (pus) and other material (debris). HOME CARE   Only take medicines as told by your doctor.  If you were given antibiotic medicine, take it as directed. Finish the medicine even if you start to feel better.  If gauze is used, follow your doctor's directions for changing the gauze.  To avoid spreading the infection:  Keep your abscess covered with a bandage.  Wash your hands well.  Do not share personal care items, towels, or whirlpools with others.  Avoid skin contact with others.  Keep your skin and clothes clean around the abscess.  Keep all doctor visits as told. GET HELP RIGHT AWAY IF:   You have more pain, puffiness (swelling), or redness in the wound site.  You have more fluid or blood coming from the wound site.  You have muscle aches, chills, or you feel sick.  You have a fever. MAKE SURE YOU:   Understand these instructions.  Will watch your condition.  Will get help right away if you are not doing well or get worse.   This information is not intended to replace advice given to you by your health care provider. Make sure you discuss any questions you have with your health care provider.   Document Released: 11/27/2007 Document Revised: 12/10/2011 Document Reviewed: 08/24/2011 Elsevier Interactive Patient Education 2016 [place].     Perirectal Abscess An abscess is an infected area that contains a collection of pus. A perirectal abscess is an abscess that is near the opening of the anus or around the rectum. A perirectal abscess can cause a lot of pain, especially during bowel movements. CAUSES This condition is almost always caused by an infection that starts in an anal gland. RISK FACTORS This condition is more likely to develop in:  People with diabetes or inflammatory bowel disease.  People whose body defense system  (immune system) is weak.  People who have anal sex.  People who have a sexually transmitted disease (STD).  People who have certain kinds of cancers, such as rectal carcinoma, leukemia, or lymphoma. SYMPTOMS The main symptom of this condition is pain. The pain may be a throbbing pain that gets worse during bowel movements. Other symptoms include:  Fever.  Swelling.  Redness.  Bleeding.  Constipation. DIAGNOSIS The condition is diagnosed with a physical exam. If the abscess is not visible, a health care provider may need to place a finger inside the rectum to find the abscess. Sometimes, imaging tests are done to determine the size and location of the abscess. These tests may include:  An ultrasound.  An MRI.  A CT scan. TREATMENT This condition is usually treated with incision and drainage surgery. Incision and drainage surgery involves making an incision over the abscess to drain the pus. Treatment may also involve antibiotic medicine, pain medicine, stool softeners, or laxatives. HOME CARE INSTRUCTIONS  Take medicines only as directed by your health care provider.  If you were prescribed an antibiotic, finish all of it even if you start to feel better.  To relieve pain, try sitting:  In a warm, shallow bath (sitz bath).  On a heating pad with the setting on low.  On an inflatable donut-shaped cushion.  Follow any diet instructions as directed by your health care provider.  Keep all follow-up visits as directed by your health care provider. This is important. SEEK MEDICAL CARE IF:  Your abscess is bleeding.  You have pain, swelling, or redness that is getting worse.  You are constipated.  You feel ill.  You have muscle aches or chills.  You have a fever.  Your symptoms return after the abscess has healed.   This information is not intended to replace advice given to you by your health care provider. Make sure you discuss any questions you have with your  health care provider.   Document Released: 06/07/2000 Document Revised: 03/01/2015 Document Reviewed: 04/20/2014 Elsevier Interactive Patient Education 2016 [place] is a service that is designed to provide people who are terminally ill and their families with medical, spiritual, and psychological support. Its aim is to improve your quality of life by keeping you as alert and comfortable as possible. Hospice is performed by a team of health care professionals and volunteers who:  Help keep you comfortable. Hospice can be provided in your home or in a homelike setting. The hospice staff works with your family and friends to help meet your needs. You will enjoy the support of loved ones by receiving much of your basic care from family and friends.  Provide pain relief and manage your symptoms. The staff supply all necessary medicines and equipment.  Provide companionship when you are alone.  Allow you and your family to rest. They may do light housekeeping, prepare meals, and run errands.  Provide counseling. They will make sure your emotional, spiritual, and social needs and those of your family are being met.  Provide spiritual care. Spiritual care is individualized to meet your needs and your family's needs. It may involve helping you look at what death means to you, say goodbye, or perform a specific religious ceremony or ritual. Hospice teams often include:  A nurse.  A doctor.  Social workers.  Religious leaders (such as a Clinical biochemist).  Trained volunteers. WHEN SHOULD HOSPICE CARE BEGIN? Most people who use hospice are believed to have fewer than 6 months to live. Your family and health care providers can help you decide when hospice services should begin. If your condition improves, you may discontinue the program. WHAT [place]? Most hospice programs are run by nonprofit, independent organizations. Some are affiliated  with hospitals, nursing homes, or home health care agencies. Hospice programs can take place in the home or at a hospice center, hospital, or skilled nursing facility. When choosing a hospice program, ask the following questions:  What services are available to me?  What services are offered to my loved ones?  How involved are my loved ones?  How involved is my health care provider?  Who makes up the hospice care team? How are they trained or screened?  How will my pain and symptoms be managed?  If my circumstances change, can the services be provided in a different setting, such as my home or in the hospital?  Is the program reviewed and licensed by the state or certified in some other way? WHERE CAN I LEARN MORE ABOUT HOSPICE? You can learn about existing hospice programs in your area from your health care providers. You can also read more about hospice online. The websites of the following organizations contain helpful information:  The [facility] and Palliative Care Organization [facility]).  The Hospice Association of America ([place]).  The [place].  The American Cancer Society (ACS).  Hospice Net.   This information is not intended to replace advice given to you by  your health care provider. Make sure you discuss any questions you have with your health care provider.   Document Released: 09/27/2003 Document Revised: 06/15/2013 Document Reviewed: 04/20/2013 Elsevier Interactive Patient Education [employer].

## 2015-12-29 NOTE — Progress Notes (Signed)
Notified by Julianne Handler of family request for [facility] at home after discharge. Chart and patient information currently under review to confirm hospice eligibility.   Spoke with patient, at bedside and sister, Lorriane Shire via phone conversation to initiate education related to hospice philosophy, services and team approach to care. Family verbalized understanding of the information provided. Per discussion, plan is for discharge to home by personal vehicle with sister today.   Patient will need prescriptions for discharge comfort medications.   DME needs discussed and family requested hospital bed with gel mattress and OBT for delivery to the home today.  [facility] equipment manager Jewel Ysidro Evert notified and will contact [place] to arrange delivery to the home.  The home address has been verified and is correct in the chart; Lorriane Shire is family member to be contacted to arrange time of delivery.   [facility] aware of the above.  Completed discharge summary will need to be faxed to [facility] at [phone] when final.  Please notify [facility] when patient is ready to leave unit at discharge-call [phone].   [facility] information and contact numbers have been given to [facility] and patient during visit.   Please call with any questions.  Thank You,  Freddi Starr RN, [facility] Liaison  [phone]

## 2015-12-29 NOTE — Progress Notes (Signed)
[id number] [place],BSN,RN3,CCM: Cristie Hem with Hospice made aware of patient going home today and need of a hospital bed with a gel overlay.  Contact person is Lorriane Shire at [phone] patient will home to [address], [place], [place] [address] via family car.  No other equipment needed.

## 2015-12-29 NOTE — Discharge Summary (Signed)
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 Protein-calorie malnutrition, severe ([place])     Priority: High  . Abscess, rectum 12/23/2015    Priority: High  . Urinary retention 12/23/2015    Priority: High  . Abscess, perirectal 12/23/2015    Priority: High  . Underweight   . Abdominal abscess ([place])   . Encounter for palliative care   . Goals of care, counseling/discussion   . CAD (coronary artery disease) 12/23/2015  . Hypertension 12/23/2015  . Dehydration 12/23/2015  . 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 Prostate cancer ([place]) 10/24/2015    Resolved Hospital Problems   Diagnosis Date Noted Date Resolved  No resolved problems to display.    Past Medical History  Diagnosis Date  . 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 CAD (coronary artery disease) 09/2014    CTO RCA  . HTN (hypertension)   . Prostate cancer ([place])   . Rectal cancer ([place])   . Colon cancer [facility])     Past Surgical History  Procedure Laterality Date  . Knee arthroscopy Left   . Cardiac defibrillator placement  10/25/2014    [employer]  . Cardiac catheterization  10/20/2014    CTO RCA w/ collat, single vessel dz, med rx    Hospital Course See H&P, Labs, Consult and Test reports for all details.  HPI: Patient is a 70 year old male diagnosed with metastatic rectal cancer a year and a half ago as well as CAD status post V. fib arrest and ICD placement admitted on 7/1 with complaints of steadily worsening back and abdominal pain and in the emergency room patient was found to have a  sacral abscess. Patient admitted to hospitalist service and started on IV antibiotics. Palliative care consulted and after meeting with patient's sister, goals of care plan is for full aggressive interventions at this time. Seen by interventional radiology and underwent CT guided drainage with drain placement. Patient seen byoncology and has been undergoing radiation treatments, missing his most recent dose on 6/30 with 12 cycles left. There also recommending holding his further chemotherapy treatments until he is in better performance condition from this abscess. Seen by general surgery who are recommending possible loop colostomy for diversion, pending cardiac clearance.  Reportedly, patient had very poor ejection fraction of 15% based off of echocardiogram done 13 months ago. Repeat echocardiogram ordered which currently shows normal ejection fraction and only grade 1 diastolic dysfunction. 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 He is still at moderate risk for surgery given his poor nutritional status.  On morning of 7/3, patient's hemoglobin dropped to 5.9 requiring 2 units packed red blood cells. That afternoon, had extensive discussion with patient and he indicated that he did not want surgery, understanding that diverting colostomy would improve his backside wound and that he could not have chemotherapy until wound had healed. This led to follow discussion with family and patient with palliative care and patient made limited code. He will continue other treatments including antibiotics. Further decisions on disposition to be made as patient progresses.  7/5: Pt wants to start ambulating. He still adamant  that he wants NO more surgery. Reports abdominal pain today.  7/6: I spoke with Dr. Linus Salmons (ID) - starting augmentin/doxycycline.  7/6: Pt met with Dr. Learta Codding and agreed to go into [facility] which was arranged prior to  discharge.  He recommended discontinuing radiation and Xeloda. He is a candidate for a [facility] referral. He agrees to a hospice referral. He will be glad to follow him as an outpatient with the [facility] program. See notes.    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 Surgery recommending diversion colostomy but patient declined. Switch to oral antibiotics today augmentin and doxycycline. Blood cultures no growth to date 7/5.   CAD (coronary artery disease) with history of V. fib arrest and ICD placement: Echocardiogram read at outside hospital 13 months ago noted markedly depressed EF and echocardiogram today only notes mild diastolic dysfunction. BNP stable. Cardiology confirmed patient's much stronger ejection fraction.  Encounter for palliative care: Appreciate palliative cares assistance. Sister who is medical power of attorney initially wanted everything done. Once patient more alert, he is able to better clinically indicated his wishes and after discussion with myself and palliative care team, family met for goals of care and plan is for continuing some therapies, however no surgery and not to be too aggressive. Patient now limited code.  Urinary retention - Pt sent home with foley catheter, follow up with urology arranged.  Started on flomax.   Severe Protein calorie malnutrition/underweight: Patient meets criteria in the context of chronic illness. Seen by nutrition. On ensure twice a day plus mighty shake daily  Acute blood loss anemia: May have been from hemoconcentration initially as well as initial wound: Now stable. Status post 2 units packed red blood cells hemoglobin today stable above 9  Code Status: Partial  Family Communication:  Mother and sister at the bedside  Disposition Plan: Home with hospice  Consultants:  General surgery  Medical oncology  Interventional radiology  Palliative care  Cardiology  Procedures:  Status post CT-guided drainage and drain placement of rectal abscess done 7/2  Echocardiogram done 7/2: Grade 1 diastolic dysfunction, EF 59-93 percent  Active Hospital Problems   Diagnosis Date Noted  . Protein-calorie malnutrition, severe ([place])     Priority: High  . Abscess, rectum 12/23/2015    Priority: High  . Urinary retention 12/23/2015    Priority: High  . Abscess, perirectal 12/23/2015    Priority: High  . Underweight   . Abdominal abscess ([place])   . Encounter for palliative care   . Goals of care, counseling/discussion   . CAD (coronary artery disease) 12/23/2015  . Hypertension 12/23/2015  . Dehydration 12/23/2015  . 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 Prostate cancer ([place]) 10/24/2015    Resolved Hospital Problems   Diagnosis Date Noted Date Resolved  No resolved problems to display.     Today's Assessment:   Subjective:   Michael Wade  Pt says that he wants to go home with hospice.  He denies complaints.    Objective:   Blood pressure 127/78, pulse 96, temperature 98.8 F (37.1 C), temperature source Oral, resp. rate 20, height 5' 11"  (1.803 m), weight 114 lb 13.8 oz (52.1 kg), SpO2 98 %.  Intake/Output Summary (Last 24 hours) at 12/29/15 1259 Last data filed at 12/29/15 0503  Gross  per 24 hour  Intake     60 ml  Output   1400 ml  Net  -1340 ml    Exam   General: Alert and oriented 2, fatigued, emaciated   Cardiovascular: Regular rhythm, S1-S2   Respiratory: Clear to auscultation bilaterally   Abdomen: Soft, non-distended, mild RLQ TTP, no guarding, bowel sounds heard  GU: brown liquid seen from drain.  Musculoskeletal: No clubbing or cyanosis or edema   Psychiatry: Seems appropriate, no evidence of psychosis    \  Lab Results  Component Value Date   WBC 10.8* 12/29/2015   WBC 17.8* 12/18/2015   HGB 9.2* 12/29/2015   HGB 8.6* 12/18/2015   HCT 26.2* 12/29/2015   HCT 25.5* 12/18/2015   PLT 259 12/29/2015   PLT 180 12/18/2015   LYMPHOPCT 1.7* 12/18/2015   MONOPCT 6.9 12/18/2015   EOSPCT 0.5 12/18/2015   BASOPCT 0.0 12/18/2015   CMP: Lab Results  Component Value Date   NA 133* 12/28/2015   NA 130* 12/18/2015   K 4.5 12/28/2015   K 3.3* 12/18/2015   CL 102 12/28/2015   CO2 27 12/28/2015   CO2 20* 12/18/2015   BUN 11 12/28/2015   BUN 13.5 12/18/2015   CREATININE 0.69 12/28/2015   CREATININE 0.9 12/18/2015   PROT 5.2* 12/25/2015   PROT 7.1 12/18/2015   ALBUMIN 2.0* 12/25/2015   ALBUMIN 2.6* 12/18/2015   BILITOT 0.9 12/25/2015   BILITOT 0.70 12/18/2015   ALKPHOS 63 12/25/2015   ALKPHOS 117 12/18/2015   AST 12* 12/25/2015   AST 11 12/18/2015   ALT 9* 12/25/2015   ALT 10 12/18/2015  . DISCHARGE MEDICATION:   Medication List    STOP taking these medications        capecitabine 500 MG tablet  Commonly known as:  XELODA     carvedilol 6.25 MG tablet  Commonly known as:  COREG     ferrous sulfate 325 (65 FE) MG tablet     furosemide 40 MG tablet  Commonly known as:  LASIX     potassium chloride SA 20 MEQ tablet  Commonly known as:  K-DUR,KLOR-CON      TAKE these medications        acetaminophen 325 MG tablet  Commonly known as:  TYLENOL  Take 2 tablets (650 mg total) by mouth every 6 (six) hours as needed for mild pain (or Fever >/= 101).     amoxicillin-clavulanate 875-125 MG tablet  Commonly known as:  AUGMENTIN  Take 1 tablet by mouth every 12 (twelve) hours.     doxycycline 100 MG tablet  Commonly known as:  VIBRA-TABS  Take 1 tablet (100 mg total) by mouth every 12 (twelve) hours.     DULoxetine 30 MG capsule  Commonly known as:  CYMBALTA  Take 1 capsule (30 mg total) by mouth daily.     feeding supplement (ENSURE ENLIVE) Liqd  Take 237 mLs by mouth  2 (two) times daily between meals.     megestrol 400 MG/10ML suspension  Commonly known as:  MEGACE  Take 5 mLs (200 mg total) by mouth 2 (two) times daily.     metoCLOPramide 10 MG tablet  Commonly known as:  REGLAN  Take 1 tablet (10 mg total) by mouth 4 (four) times daily.     oxyCODONE-acetaminophen 10-325 MG tablet  Commonly known as:  PERCOCET  Take 1 tablet by mouth every 4 (four) hours as needed for pain.     pantoprazole 40 MG  tablet  Commonly known as:  PROTONIX  Take 1 tablet (40 mg total) by mouth daily.     psyllium 0.52 g capsule  Commonly known as:  REGULOID  Take 0.52 g by mouth as directed.     pyridOXINE 50 MG tablet  Commonly known as:  B-6  Take 50 mg by mouth daily.     tamsulosin 0.4 MG Caps capsule  Commonly known as:  FLOMAX  Take 1 capsule (0.4 mg total) by mouth daily.        Disposition and Follow-up:      Follow-up Information    Follow up with [name] III, DAYNE DANIEL, MD. Schedule an appointment as soon as possible for a visit in 1 week.   Specialty:  Interventional Radiology   Why:  Hospital Follow Up [facility] information:   [address] [place] [address] [phone]       Follow up with Betsy Coder, MD. Schedule an appointment as soon as possible for a visit in 1 week.   Specialty:  Oncology   Why:  Hospital Follow Up   Contact information:   [place] [address] [phone]       Follow up with Alexis Frock, MD On 01/08/2016.   Specialty:  Urology   Why:  As already scheduled for urology care    Contact information:   [place] [address] [phone]      The risks, benefits, and possible side effects of all treatments and tests were explained to the patient.  The patient verbalized understanding.  The importance of close follow up with the primary care medical provider was explained clearly to the patient.  The patient verbalized  understanding.  The patient was given instructions to return if symptoms recur, worsen or new changes develop.  The patient verbalized understanding.   Murlean Iba, MD, CDE, [place], [place]   Total Time spent reviewing critical document, reviewing this patient's comprehensive hospitalization, arranging follow up and coordination of care, reviewing data and todays exam greater than 35 minutes.   SignedIrwin Brakeman MD 12/29/2015 12:59 PM

## 2015-12-29 NOTE — Progress Notes (Signed)
Patient ID: Michael Wade, male   DOB: May 11, 1946, 70 y.o.   MRN: [id number]    Referring Physician(s): Joyice Faster  Supervising Physician: Corrie Mckusick  Patient Status:  Inpatient  Chief Complaint: Perirectal abscess   Subjective: Pt awaiting d/c home with hospice care; no acute changes   Allergies: Review of patient's allergies indicates no known allergies.  Medications: Prior to Admission medications   Medication Sig Start Date End Date Taking? Authorizing Provider  capecitabine (XELODA) 500 MG tablet Take 3 tablets (1,500 mg) QAM 2 tabs (1,000 mg) QPM on days of radiation only (Mon-Fri) 11/08/15  Yes Ladell Pier, MD  carvedilol (COREG) 6.25 MG tablet Take 6.25 mg by mouth daily. 07/12/15 07/11/16 Yes Historical Provider, MD  DULoxetine (CYMBALTA) 30 MG capsule Take 1 capsule (30 mg total) by mouth daily. 12/18/15  Yes Owens Shark, NP  ferrous sulfate 325 (65 FE) MG tablet Take 325 mg by mouth daily with breakfast.   Yes Historical Provider, MD  furosemide (LASIX) 40 MG tablet Take 0.5 tablets (20 mg total) by mouth daily. 12/15/15  Yes Hayden Pedro, PA-C  megestrol (MEGACE) 400 MG/10ML suspension Take 5 mLs (200 mg total) by mouth 2 (two) times daily. 12/08/15  Yes Hayden Pedro, PA-C  pantoprazole (PROTONIX) 40 MG tablet Take 1 tablet (40 mg total) by mouth daily. 12/18/15 12/17/16 Yes Owens Shark, NP  potassium chloride SA (K-DUR,KLOR-CON) 20 MEQ tablet Take 1 tablet (20 mEq total) by mouth daily. 12/19/15  Yes Owens Shark, NP  psyllium (REGULOID) 0.52 g capsule Take 0.52 g by mouth as directed.   Yes Historical Provider, MD  pyridOXINE (B-6) 50 MG tablet Take 50 mg by mouth daily.   Yes Historical Provider, MD  acetaminophen (TYLENOL) 325 MG tablet Take 2 tablets (650 mg total) by mouth every 6 (six) hours as needed for mild pain (or Fever >/= 101). 12/29/15   Clanford Marisa Hua, MD  amoxicillin-clavulanate (AUGMENTIN) 875-125 MG tablet Take 1 tablet by mouth  every 12 (twelve) hours. 12/29/15 01/05/16  Clanford Marisa Hua, MD  doxycycline (VIBRA-TABS) 100 MG tablet Take 1 tablet (100 mg total) by mouth every 12 (twelve) hours. 12/29/15 01/05/16  Clanford Marisa Hua, MD  feeding supplement, ENSURE ENLIVE, (ENSURE ENLIVE) LIQD Take 237 mLs by mouth 2 (two) times daily between meals. 12/29/15   Clanford Marisa Hua, MD  metoCLOPramide (REGLAN) 10 MG tablet Take 1 tablet (10 mg total) by mouth 4 (four) times daily. Patient not taking: Reported on 12/23/2015 12/19/15   Kyung Rudd, MD  oxyCODONE-acetaminophen (PERCOCET) 10-325 MG tablet Take 1 tablet by mouth every 4 (four) hours as needed for pain. 12/29/15   Clanford Marisa Hua, MD  tamsulosin (FLOMAX) 0.4 MG CAPS capsule Take 1 capsule (0.4 mg total) by mouth daily. 12/29/15   Clanford Marisa Hua, MD     Vital Signs: BP 133/80 mmHg  Pulse 82  Temp(Src) 98.8 F (37.1 C) (Oral)  Resp 20  Ht 5\' 11"  (1.803 m)  Wt 114 lb 13.8 oz (52.1 kg)  BMI 16.03 kg/m2  SpO2 100%  Physical Exam left TG drain intact, insertion site ok, mildly tender, output 125 cc feculent fluid/air; cx's- mult org  Imaging: No results found.  Labs:  CBC:  Recent Labs  12/26/15 1015 12/27/15 0450 12/28/15 0400 12/29/15 0450  WBC 8.0 9.2 9.1 10.8*  HGB 9.4* 9.7* 10.0* 9.2*  HCT 26.5* 27.4* 29.0* 26.2*  PLT 237 285 275 259    COAGS:  Recent  Labs  12/24/15 0528  INR 1.46    BMP:  Recent Labs  12/25/15 0445 12/26/15 0435 12/27/15 0450 12/28/15 0400  NA 131* 133* 131* 133*  K 2.6* 3.3* 3.9 4.5  CL 102 102 100* 102  CO2 22 25 26 27   GLUCOSE 111* 114* 100* 101*  BUN 11 12 11 11   CALCIUM 7.7* 8.0* 8.2* 8.4*  CREATININE 0.81 0.73 0.77 0.69  GFRNONAA >60 >60 >60 >60  GFRAA >60 >60 >60 >60    LIVER FUNCTION TESTS:  Recent Labs  12/18/15 1349 12/23/15 1605 12/24/15 0528 12/25/15 0445  BILITOT 0.70 0.9 0.9 0.9  AST 11 17 13* 12*  ALT 10 13* 10* 9*  ALKPHOS 117 98 83 63  PROT 7.1 7.0 6.3* 5.2*  ALBUMIN 2.6* 2.7*  2.4* 2.0*    Assessment and Plan: S/p presacral abscess drain 7/2 (hx stage IV rectal ca); AF; WBC 10.8, hgb 9.2; drain fuid cx's - mult org; rec once daily irrigation of drain with 5-10 cc sterile NS as OP, recording of output, dressing changes daily; will schedule for IR clinic visit/f/u CT in 1-2 weeks   Electronically Signed: D. Rowe Robert 12/29/2015, 3:13 PM   I spent a total of 15 minutes at the the patient's bedside AND on the patient's hospital floor or unit, greater than 50% of which was counseling/coordinating care for pelvic abscess drain

## 2015-12-29 NOTE — Progress Notes (Signed)
PT Cancellation Note  Patient Details Name: Kollyn Kovanda MRN: [id number] DOB: 1945/10/05   Cancelled Treatment:    Reason Eval/Treat Not Completed: Pt declined to attempt ambulation on today due to leaking. Will check back another day.    Weston Anna, MPT Pager: [phone]

## 2015-12-29 NOTE — Progress Notes (Signed)
Date: December 29 2015/Shaft Corigliano Rosana Hoes, RN, BSN, CCM   [phone] Rosana Hoes with [facility]. Referral given and she will see patient.

## 2015-12-29 NOTE — Progress Notes (Signed)
Date:  December 29, 2015 Chart reviewed for concurrent status and case management needs. Will continue to follow the patient for changes and needs:  From the chart:- No surgery per patients wishes -Continue antibiotics -Continue to monitor progress, functional status, pain management -Introduced concept of MOST form today. He would like to review this evening and is agreeable to f/u tomorrow to continue conversation on long term goals. Expected discharge date: [id number] Velva Harman, BSN, [place], [place]

## 2015-12-30 LAB — CULTURE, BLOOD (ROUTINE X 2)
CULTURE: NO GROWTH
Culture: NO GROWTH

## 2016-01-01 ENCOUNTER — Ambulatory Visit: Payer: Medicare (Managed Care)

## 2016-01-01 ENCOUNTER — Telehealth: Payer: Self-pay | Admitting: *Deleted

## 2016-01-01 ENCOUNTER — Other Ambulatory Visit: Payer: Self-pay | Admitting: General Surgery

## 2016-01-01 ENCOUNTER — Other Ambulatory Visit: Payer: Self-pay | Admitting: Oncology

## 2016-01-01 ENCOUNTER — Encounter: Payer: Self-pay | Admitting: *Deleted

## 2016-01-01 DIAGNOSIS — K611 Rectal abscess: Secondary | ICD-10-CM

## 2016-01-01 NOTE — Telephone Encounter (Signed)
Call received from patient's sister asking if appointment is needed tomorrow for lab, flush and F/U with Marcello Moores, NP.  He was discharged with Hospice and the nurse is coming tomorrow.  The port-a-cath was used in the hospital.  Return number [phone].

## 2016-01-01 NOTE — Progress Notes (Signed)
Call received from [place] with [facility] requesting OK per Dr. Benay Spice to add Senokot to patients medication list.  OK for Senokot per [facility] per Dr. Benay Spice.

## 2016-01-01 NOTE — Telephone Encounter (Signed)
Spoke with pt's mother, informed her we are glad to see pt in the office. If he doesn't think he needs appt we can cancel and reschedule as needed. She will discuss with pt and Lorriane Shire and have them call office with their decision.

## 2016-01-02 ENCOUNTER — Other Ambulatory Visit: Payer: Managed Care, Other (non HMO)

## 2016-01-02 ENCOUNTER — Ambulatory Visit: Payer: Managed Care, Other (non HMO) | Admitting: Nurse Practitioner

## 2016-01-02 ENCOUNTER — Telehealth: Payer: Self-pay | Admitting: *Deleted

## 2016-01-02 ENCOUNTER — Ambulatory Visit: Payer: Medicare (Managed Care)

## 2016-01-02 MED ORDER — MORPHINE SULFATE ER 15 MG PO TBCR: 15.0000 mg | EXTENDED_RELEASE_TABLET | Freq: Two times a day (BID) | ORAL | Status: DC

## 2016-01-02 NOTE — Telephone Encounter (Signed)
Oncology Nurse Navigator Documentation  Oncology Nurse Navigator Flowsheets 01/02/2016  Navigator Location [facility]Med Onc  Navigator Encounter Type Telephone  Telephone Patient Update;Appt Confirmation/Clarification;Incoming Call  Patient Visit Type -  Treatment Phase Other--[facility] home-RN 1st visit will be today  Barriers/Navigation Needs Coordination of Care  Education -  Interventions Coordination of Care--message to rad onc that Dr. Benay Spice said all tx is on hold-  Referrals -  Coordination of Care Appts--rescheduled OV with med onc for 7/24 at 11:45 and sister notified.  Education Method -  Acuity Level 1  Time Spent with Patient 30  Has appointment on 7/18 at 10:15 with Dr. Vernard Gambles in radiology regarding his drain and appointment on 7/17 at [place] with urology, Dr. Tresa Moore. Prefers appointments late morning if possible.

## 2016-01-02 NOTE — Telephone Encounter (Signed)
Call from hospice RN, Shirlean Mylar reporting pt's pain is not well controlled with current regimen. He is taking #2 Percocet 10/325 every 6 hours. Rates pain at 7/10 with Percocet. Shirlean Mylar is requesting addition of long acting pain med and/or changing current medication. Will review with MD.  1640: Per Dr. Benay Spice: Begin MS Contin 15 mg Q12 hours for pain. Continue Oxycodone PRN breakthrough pain. Order called to Vicie Mutters, Hospice RN, faxed to pharmacy.

## 2016-01-03 ENCOUNTER — Ambulatory Visit: Payer: Medicare (Managed Care)

## 2016-01-04 ENCOUNTER — Ambulatory Visit: Admission: RE | Admit: 2016-01-04 | Payer: Medicare (Managed Care) | Source: Ambulatory Visit

## 2016-01-04 NOTE — Progress Notes (Signed)
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     Indication for treatment:  Palliative       Radiation treatment dates:   11/30/2015 to 12/21/2015  Site/dose:    The patient received 35.2 Gy in 16 fractions at 2.2 Gy per fraction to the prostate.  The patient received 28.8 Gy in 16 fractions at 1.8 Gy per fraction to the rectum.  The patient was planned to receive 55 Gy in 25 fraction at 2.2 Gy per fraction to the prostate. The patient was planned to receive 45 Gy in 25 fractions at 1.8 Gy per fraction to the rectum.  Beams/energy:   IMRT // 6X  Narrative: The patient's status declined during his course of treatment and he was found to have a large pelvis abscess. After considering treatment options, he decided to forgo further radiation treatment.  Plan: The patient has completed radiation treatment. The patient will return to radiation oncology clinic for routine followup in one month. I advised them to call or return sooner if they have any questions or concerns related to their recovery or treatment.  ------------------------------------------------  Jodelle Gross, MD, PhD  This document serves as a record of services personally performed by Kyung Rudd, MD. It was created on his behalf by Arlyce Harman, a trained medical scribe. The creation of this record is based on the scribe's personal observations and the provider's statements to them. This document has been checked and approved by the attending provider.

## 2016-01-05 ENCOUNTER — Other Ambulatory Visit: Payer: Self-pay | Admitting: General Surgery

## 2016-01-05 ENCOUNTER — Ambulatory Visit: Payer: Medicare (Managed Care)

## 2016-01-05 DIAGNOSIS — K611 Rectal abscess: Secondary | ICD-10-CM

## 2016-01-08 ENCOUNTER — Ambulatory Visit: Payer: Medicare (Managed Care)

## 2016-01-09 ENCOUNTER — Ambulatory Visit
Admission: RE | Admit: 2016-01-09 | Discharge: 2016-01-09 | Disposition: A | Discharge status: Home / Self Care | Payer: 59 | Source: Ambulatory Visit | Attending: Radiology | Admitting: Radiology

## 2016-01-09 ENCOUNTER — Ambulatory Visit: Payer: Medicare (Managed Care)

## 2016-01-09 ENCOUNTER — Ambulatory Visit
Admission: RE | Admit: 2016-01-09 | Discharge: 2016-01-09 | Disposition: A | Discharge status: Home / Self Care | Payer: 59 | Source: Ambulatory Visit | Attending: General Surgery | Admitting: General Surgery

## 2016-01-09 DIAGNOSIS — K611 Rectal abscess: Secondary | ICD-10-CM

## 2016-01-09 HISTORY — PX: IR GENERIC HISTORICAL: IMG1180011

## 2016-01-09 MED ORDER — IOPAMIDOL (ISOVUE-300) INJECTION 61%
100.0000 mL | Freq: Once | INTRAVENOUS | Status: AC | PRN
  Administered 2016-01-09: 100 mL via INTRAVENOUS

## 2016-01-09 NOTE — Consult Note (Signed)
Chief Complaint: Drainage catheter evaluation and management  Referring Physician(s): Leighton Ruff, Julieanne Manson (oncology)  History of Present Illness:  Michael Wade is a 70 y.o. male with past history significant for coronary artery disease, hypertension, prostate cancer and most noticeably stage IV rectal cancer. Patient was found to have a large presacral abscess on CT scan of the abdomen and pelvis performed 12/23/2015 for which he underwent a technically successful left trans-gluteal approach percutaneous drainage catheter placement by Dr. Vernard Gambles on 12/24/2015. He presents to the interventional radiology drain clinic for drainage catheter evaluation and management following the acquisition of a CT scan of the abdomen and pelvis. He is accompanied by his sister though serves as his own historian.  The patient reports persistent high-volume outputs from the percutaneous drainage catheter, the majority of which appears to represent stool.  Patient denies fever or chills. The patient is not currently taking antibiotics.  Past Medical History  Diagnosis Date  . 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 CAD (coronary artery disease) 09/2014    CTO RCA  . HTN (hypertension)   . Prostate cancer ([place])   . Rectal cancer ([place])   . Colon cancer [facility])     Past Surgical History  Procedure Laterality Date  . Knee arthroscopy Left   . Cardiac defibrillator placement  10/25/2014    [employer]  . Cardiac catheterization  10/20/2014    CTO RCA w/ collat, single vessel dz, med rx    Allergies: Review of patient's allergies indicates no known allergies.  Medications: Prior to Admission medications   Medication Sig Start Date End Date Taking? Authorizing Provider  acetaminophen (TYLENOL) 325 MG tablet Take 2 tablets (650 mg total) by mouth every 6 (six) hours as needed for mild pain (or Fever >/= 101). 12/29/15   Clanford L Johnson, MD  DULoxetine (CYMBALTA) 30 MG capsule Take 1  capsule (30 mg total) by mouth daily. 12/18/15   Owens Shark, NP  feeding supplement, ENSURE ENLIVE, (ENSURE ENLIVE) LIQD Take 237 mLs by mouth 2 (two) times daily between meals. 12/29/15   Clanford Marisa Hua, MD  megestrol (MEGACE) 400 MG/10ML suspension Take 5 mLs (200 mg total) by mouth 2 (two) times daily. 12/08/15   Hayden Pedro, PA-C  metoCLOPramide (REGLAN) 10 MG tablet Take 1 tablet (10 mg total) by mouth 4 (four) times daily. Patient not taking: Reported on 12/23/2015 12/19/15   Kyung Rudd, MD  morphine (MS CONTIN) 15 MG 12 hr tablet Take 1 tablet (15 mg total) by mouth every 12 (twelve) hours. 01/02/16   Ladell Pier, MD  oxyCODONE-acetaminophen (PERCOCET) 10-325 MG tablet Take 1 tablet by mouth every 4 (four) hours as needed for pain. 12/29/15   Clanford Marisa Hua, MD  pantoprazole (PROTONIX) 40 MG tablet Take 1 tablet (40 mg total) by mouth daily. 12/18/15 12/17/16  Owens Shark, NP  psyllium (REGULOID) 0.52 g capsule Take 0.52 g by mouth as directed.    Historical Provider, MD  pyridOXINE (B-6) 50 MG tablet Take 50 mg by mouth daily.    Historical Provider, MD  tamsulosin (FLOMAX) 0.4 MG CAPS capsule Take 1 capsule (0.4 mg total) by mouth daily. 12/29/15   Clanford Marisa Hua, MD     Family History  Problem Relation Age of Onset  . Prostate cancer Father     Social History   Social History  . Marital Status: Single    Spouse Name: N/A  . Number of Children:  0  . Years of Education: N/A   Occupational History  . retired     Personal assistant   Social History Main Topics  . Smoking status: Former Smoker    Types: Cigarettes  . Smokeless tobacco: Never Used     Comment: Smoked from [id number], 1/2 PPD  . Alcohol Use: No  . Drug Use: No  . Sexual Activity: Not on file   Other Topics Concern  . Not on file   Social History Narrative   Single, no children   Retired from Personal assistant   Lives w/sister and his mom   Ambulates-has walker--FALL RISK    ECOG Status: 2 -  Symptomatic, <50% confined to bed  Review of Systems: A 12 point ROS discussed and pertinent positives are indicated in the HPI above.  All other systems are negative.  Review of Systems  Vital Signs: BP 112/72 mmHg  Pulse 80  Temp(Src) 98.2 F (36.8 C) (Oral)  SpO2 96%  Physical Exam  Constitutional:  Patient is cachectic.  Psychiatric: He has a normal mood and affect.     Imaging:  CT scan of the abdomen and pelvis performed earlier same day - dictated by the consulting interventional radiologists.  Ct Abdomen Pelvis W Contrast  12/23/2015  CLINICAL DATA:  Abdominal pain and diarrhea for a few months. 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 EXAM: CT ABDOMEN AND PELVIS WITH CONTRAST TECHNIQUE: Multidetector CT imaging of the abdomen and pelvis was performed using the standard protocol following bolus administration of intravenous contrast. CONTRAST:  50mL ISOVUE-300 IOPAMIDOL (ISOVUE-300) INJECTION 61% COMPARISON:  Outside CT from 11/03/2015.  Outside CT from 08/06/2014 FINDINGS: Lower chest:  Centrilobular emphysema noted in the lung bases. Hepatobiliary: Scattered tiny hypodensities in the liver parenchyma are stable. No enhancing lesion is identified within the liver parenchyma. There is no evidence for gallstones, gallbladder wall thickening, or pericholecystic fluid. Mild intrahepatic biliary duct dilatation has progressed slightly in the interval. Common bile duct in the head of the pancreas measures 8 mm in diameter today compared to 6 mm on 11/03/2015. Pancreas: Slight prominence of the main pancreatic duct, stable. Spleen: No splenomegaly. No focal mass lesion. Adrenals/Urinary Tract: No adrenal nodule or mass. Atrophic left kidney. No evidence for enhancing renal lesion with evidence of decreased perfusion of the left kidney. No evidence for hydroureter. Bladder is markedly distended. Stomach/Bowel:  Stomach is nondistended. No gastric wall thickening. No evidence of outlet obstruction. Duodenum is normally positioned as is the ligament of Treitz. Small bowel loops are nondilated. Terminal ileum unremarkable. The appendix is not visualized, but there is no edema or inflammation in the region of the cecum. Colon is diffusely distended and stool-filled from the cecum to the rectum. Vascular/Lymphatic: There is abdominal aortic atherosclerosis without aneurysm. Abdominal aorta measures up to 2.9 cm in diameter. No evidence for gastrohepatic or hepatoduodenal ligament lymphadenopathy. No abdominal retroperitoneal lymphadenopathy on the current study. No evidence for pelvic sidewall lymphadenopathy. Reproductive: The prostate gland and seminal vesicles have normal imaging features. Other: A large collection of fluid, debris, and gas is identified in the presacral space, between the rectum and the sacrum. The main component of this complex process measures 13.5 cm in coronal diameter by 9 cm in craniocaudal diameter by 4.5 cm in AP diameter. There is extension of this process posteriorly in the right gluteus musculature, behind the right acetabulum. Gas dissects around to the right side of the  rectum, displacing the rectum to the left. There is associated wall thickening in the rectum and perirectal edema/ inflammation. Musculoskeletal: Bone windows reveal no worrisome lytic or sclerotic osseous lesions. IMPRESSION: 1. Large complex collection of gas, debris, and fluid in the presacral space compatible with abscess. This dissects down around to the right of the rectum and laterally into the right gluteus musculature behind the right acetabulum. No direct communication to the rectal lumen is evident, but features are probably related to rectal perforation in this patient with a history of rectal carcinoma. 2. Markedly distended urinary bladder with the dome of the bladder cranial to the umbilicus. Imaging features are  compatible with urinary retention. 3. Atrophic left kidney. 4. Scattered low-density liver lesions stable comparing back to the oldest outside imaging study of 08/06/2014. Imaging findings of potential clinical significance: Emphysema. [id number]) Aortic Atherosclerosis (ICD10-170.0) Electronically Signed   By: Misty Stanley M.D.   On: 12/23/2015 19:36   Dg Abd Acute W/chest  12/23/2015  CLINICAL DATA:  Abdominal pain for a few months. EXAM: DG ABDOMEN ACUTE W/ 1V CHEST COMPARISON:  No comparison studies available. FINDINGS: The lungs are clear wiithout focal pneumonia, edema, pneumothorax or pleural effusion. The cardiopericardial silhouette is within normal limits for size. Left-sided pacer/AICD noted. A right Port-A-Cath is visualized with distal tip position overlying the distal SVC. Old posterior right rib fractures noted. Right-side-up decubitus film shows no evidence for intraperitoneal free air. Supine view the abdomen shows no gaseous bowel dilatation suggest obstruction. Prominent stool volume is seen along the length of the colon. Bones are diffusely demineralized. IMPRESSION: 1. No acute cardiopulmonary findings. 2. No evidence for bowel perforation or obstruction. 3. Large colonic stool volume. Imaging features could be compatible with clinical constipation. Electronically Signed   By: Misty Stanley M.D.   On: 12/23/2015 16:53   Ct Image Guided Drainage Percut Cath  Peritoneal Retroperit  12/24/2015  CLINICAL DATA:  Rectal carcinoma, post perforation, with large presacral abscess EXAM: CT GUIDED DRAINAGE OF PELVIC PRESACRAL ABSCESS ANESTHESIA/SEDATION: Intravenous Fentanyl and Versed were administered as conscious sedation during continuous monitoring of the patient's level of consciousness and physiological / cardiorespiratory status by the radiology RN, with a total moderate sedation time of 12 minutes. PROCEDURE: The procedure, risks, benefits, and alternatives were explained to the patient.  Questions regarding the procedure were encouraged and answered. The patient understands and consents to the procedure. Patient placed prone. Select axial scans through the pelvis obtained. An appropriate skin entry site was determined and marked. The operative field was prepped with chlorhexidinein a sterile fashion, and a sterile drape was applied covering the operative field. A sterile gown and sterile gloves were used for the procedure. Local anesthesia was provided with 1% Lidocaine. Under CT fluoroscopic guidance, a 19 gauge percutaneous entry needle was advanced into the collection from a rib left trans gluteal approach. Purulent material spontaneously returned through the needle lobe. An Amplatz wire advanced easily within the collection, its position confirmed on CT. Tract was dilated to facilitate placement of a 12 French pigtail catheter, placed centrally within the collection. 60 mL purulent material were aspirated, sent for routine Gram stain and culture. Catheter secured externally with 0 Prolene suture and stat lock, and placed to gravity drain bag. The patient tolerated the procedure well. COMPLICATIONS: None immediate FINDINGS: Complex presacral gas and fluid collecting dissecting into the deep right gluteal muscular planes again identified. CT-guided abscess drain catheter placement from a left-sided approach was performed. IMPRESSION: 1. Technically successful  CT-guided presacral pelvic abscess drain catheter placement Electronically Signed   By: Lucrezia Europe M.D.   On: 12/24/2015 14:06    Labs:  CBC:  Recent Labs  12/26/15 1015 12/27/15 0450 12/28/15 0400 12/29/15 0450  WBC 8.0 9.2 9.1 10.8*  HGB 9.4* 9.7* 10.0* 9.2*  HCT 26.5* 27.4* 29.0* 26.2*  PLT 237 285 275 259    COAGS:  Recent Labs  12/24/15 0528  INR 1.46    BMP:  Recent Labs  12/25/15 0445 12/26/15 0435 12/27/15 0450 12/28/15 0400  NA 131* 133* 131* 133*  K 2.6* 3.3* 3.9 4.5  CL 102 102 100* 102  CO2 22  25 26 27   GLUCOSE 111* 114* 100* 101*  BUN 11 12 11 11   CALCIUM 7.7* 8.0* 8.2* 8.4*  CREATININE 0.81 0.73 0.77 0.69  GFRNONAA >60 >60 >60 >60  GFRAA >60 >60 >60 >60    LIVER FUNCTION TESTS:  Recent Labs  12/18/15 1349 12/23/15 1605 12/24/15 0528 12/25/15 0445  BILITOT 0.70 0.9 0.9 0.9  AST 11 17 13* 12*  ALT 10 13* 10* 9*  ALKPHOS 117 98 83 63  PROT 7.1 7.0 6.3* 5.2*  ALBUMIN 2.6* 2.7* 2.4* 2.0*    Assessment and Plan:  Ollie Zamorski is a 70 y.o. male with past history significant for coronary artery disease, hypertension, prostate cancer and most noticeably stage IV rectal cancer. Patient was found to have a large presacral abscess on CT scan of the abdomen and pelvis performed 12/23/2015 for which he underwent a technically successful left trans-gluteal approach percutaneous drainage catheter placement on 12/24/2015.  Review of CT scan of the abdomen and pelvis performed earlier today demonstrates unchanged positioning of the left transgluteal approach percutaneous drainage catheter. There has been minimal reduction in the persistent large air and fluid containing presacral abscess. Additionally, the patient reports continued high-volume output from the percutaneous drainage catheter.  Given the above imaging findings and clinical situation, the decision was made to schedule the patient for a fluoroscopic guided percutaneous drainage catheter repositioning and upsizing in hopes of obtaining improved infection source control   The patient and the patient's sister are in agreement with this proposed plan of care. This will be performed at outpatient basis at [facility] in the next 2-3 days.  A copy of this report was sent to the requesting provider on this date.  Electronically Signed: Sandi Mariscal 01/09/2016, 12:59 PM   I spent a total of 10 Minutes in face to face in clinical consultation, greater than 50% of which was counseling/coordinating care for drainage  catheter

## 2016-01-10 ENCOUNTER — Other Ambulatory Visit: Payer: Self-pay | Admitting: Radiology

## 2016-01-10 ENCOUNTER — Ambulatory Visit: Payer: Medicare (Managed Care)

## 2016-01-10 ENCOUNTER — Other Ambulatory Visit (HOSPITAL_COMMUNITY): Payer: Self-pay | Admitting: Interventional Radiology

## 2016-01-10 DIAGNOSIS — K611 Rectal abscess: Secondary | ICD-10-CM

## 2016-01-11 ENCOUNTER — Ambulatory Visit: Payer: Medicare (Managed Care)

## 2016-01-11 ENCOUNTER — Other Ambulatory Visit: Payer: Self-pay | Admitting: Radiology

## 2016-01-11 ENCOUNTER — Ambulatory Visit (HOSPITAL_COMMUNITY)
Admission: RE | Admit: 2016-01-11 | Discharge: 2016-01-11 | Disposition: A | Discharge status: Home / Self Care | Source: Ambulatory Visit | Attending: Interventional Radiology | Admitting: Interventional Radiology

## 2016-01-11 ENCOUNTER — Encounter (HOSPITAL_COMMUNITY): Payer: Self-pay

## 2016-01-11 ENCOUNTER — Other Ambulatory Visit: Payer: Self-pay | Admitting: General Surgery

## 2016-01-11 DIAGNOSIS — I10 Essential (primary) hypertension: Secondary | ICD-10-CM | POA: Insufficient documentation

## 2016-01-11 DIAGNOSIS — Z923 Personal history of irradiation: Secondary | ICD-10-CM | POA: Diagnosis not present

## 2016-01-11 DIAGNOSIS — K651 Peritoneal abscess: Secondary | ICD-10-CM | POA: Diagnosis not present

## 2016-01-11 DIAGNOSIS — Z9221 Personal history of antineoplastic chemotherapy: Secondary | ICD-10-CM | POA: Insufficient documentation

## 2016-01-11 DIAGNOSIS — C7800 Secondary malignant neoplasm of unspecified lung: Secondary | ICD-10-CM | POA: Insufficient documentation

## 2016-01-11 DIAGNOSIS — C61 Malignant neoplasm of prostate: Secondary | ICD-10-CM | POA: Diagnosis not present

## 2016-01-11 DIAGNOSIS — I2510 Atherosclerotic heart disease of native coronary artery without angina pectoris: Secondary | ICD-10-CM | POA: Diagnosis not present

## 2016-01-11 DIAGNOSIS — Z8042 Family history of malignant neoplasm of prostate: Secondary | ICD-10-CM | POA: Insufficient documentation

## 2016-01-11 DIAGNOSIS — K611 Rectal abscess: Secondary | ICD-10-CM

## 2016-01-11 DIAGNOSIS — Z95810 Presence of automatic (implantable) cardiac defibrillator: Secondary | ICD-10-CM | POA: Diagnosis not present

## 2016-01-11 DIAGNOSIS — C20 Malignant neoplasm of rectum: Secondary | ICD-10-CM | POA: Insufficient documentation

## 2016-01-11 DIAGNOSIS — Z87891 Personal history of nicotine dependence: Secondary | ICD-10-CM | POA: Diagnosis not present

## 2016-01-11 DIAGNOSIS — C779 Secondary and unspecified malignant neoplasm of lymph node, unspecified: Secondary | ICD-10-CM | POA: Insufficient documentation

## 2016-01-11 DIAGNOSIS — Z4803 Encounter for change or removal of drains: Secondary | ICD-10-CM | POA: Diagnosis present

## 2016-01-11 LAB — CBC
HEMATOCRIT: 31 % — AB (ref 39.0–52.0)
HEMOGLOBIN: 10.3 g/dL — AB (ref 13.0–17.0)
MCH: 30.8 pg (ref 26.0–34.0)
MCHC: 33.2 g/dL (ref 30.0–36.0)
MCV: 92.8 fL (ref 78.0–100.0)
Platelets: 363 10*3/uL (ref 150–400)
RBC: 3.34 MIL/uL — ABNORMAL LOW (ref 4.22–5.81)
RDW: 21.4 % — ABNORMAL HIGH (ref 11.5–15.5)
WBC: 19.7 10*3/uL — AB (ref 4.0–10.5)

## 2016-01-11 LAB — PROTIME-INR
INR: 1.16 (ref 0.00–1.49)
Prothrombin Time: 15 seconds (ref 11.6–15.2)

## 2016-01-11 MED ORDER — FENTANYL CITRATE (PF) 100 MCG/2ML IJ SOLN
INTRAMUSCULAR | Status: AC
  Filled 2016-01-11: qty 2

## 2016-01-11 MED ORDER — CEFAZOLIN SODIUM-DEXTROSE 2-4 GM/100ML-% IV SOLN: 2.0000 g | Freq: Once | INTRAVENOUS | Status: DC

## 2016-01-11 MED ORDER — SODIUM CHLORIDE 0.9 % IV SOLN: Freq: Once | INTRAVENOUS | Status: DC

## 2016-01-11 MED ORDER — IOPAMIDOL (ISOVUE-300) INJECTION 61%
INTRAVENOUS | Status: AC
Start: 2016-01-11 — End: 2016-01-11
  Administered 2016-01-11: 10 mL
  Filled 2016-01-11: qty 50

## 2016-01-11 MED ORDER — CEFAZOLIN SODIUM-DEXTROSE 2-4 GM/100ML-% IV SOLN
INTRAVENOUS | Status: AC
  Administered 2016-01-11: 2000 mg
  Filled 2016-01-11: qty 100

## 2016-01-11 MED ORDER — MIDAZOLAM HCL 2 MG/2ML IJ SOLN
INTRAMUSCULAR | Status: AC
Start: 2016-01-11 — End: 2016-01-11
  Filled 2016-01-11: qty 2

## 2016-01-11 MED ORDER — LIDOCAINE HCL 1 % IJ SOLN
INTRAMUSCULAR | Status: AC
  Filled 2016-01-11: qty 20

## 2016-01-11 MED ORDER — SODIUM CHLORIDE 0.9 % IV SOLN
INTRAVENOUS | Status: AC | PRN
  Administered 2016-01-11: 75 mL/h via INTRAVENOUS

## 2016-01-11 MED ORDER — FENTANYL CITRATE (PF) 100 MCG/2ML IJ SOLN
INTRAMUSCULAR | Status: AC | PRN
  Administered 2016-01-11 (×2): 25 ug via INTRAVENOUS

## 2016-01-11 MED ORDER — MIDAZOLAM HCL 2 MG/2ML IJ SOLN
INTRAMUSCULAR | Status: AC | PRN
  Administered 2016-01-11: 1 mg via INTRAVENOUS

## 2016-01-11 NOTE — Sedation Documentation (Signed)
O2 d/c'd 

## 2016-01-11 NOTE — Procedures (Signed)
Technically successful fluoro guided conversion and upsizing of now 14 Fr pre-sacral drainage catheter yielding 100 cc of purulent, foul smelling fluid.    EBL: None  No immediate post procedural complications.   Ronny Bacon, MD Pager #: [phone]

## 2016-01-11 NOTE — Discharge Instructions (Signed)
Percutaneous Abscess Drain, Care After °Refer to this sheet in the next few weeks. These instructions provide you with information on caring for yourself after your procedure. Your health care provider may also give you more specific instructions. Your treatment has been planned according to current medical practices, but problems sometimes occur. Call your health care provider if you have any problems or questions after your procedure. °WHAT TO EXPECT AFTER THE PROCEDURE °After your procedure, it is typical to have the following:  °· A small amount of discomfort in the area where the drainage tube was placed. °· A small amount of bruising around the area where the drainage tube was placed. °· Sleepiness and fatigue for the rest of the day from the medicines used. °HOME CARE INSTRUCTIONS °· Rest at home for 1-2 days following your procedure or as directed by your health care provider. °· If you go home right after the procedure, plan to have someone with you for 24 hours. °· Do not take a bath or shower for 24 hours after your procedure. °· Take medicines only as directed by your health care provider. Ask your health care provider when you can resume taking any normal medicines. °· Change bandages (dressings) as directed.   °· You may be told to record the amount of drainage from the bag every time you empty it. Follow your health care provider's directions for emptying the bag. Write down the amount of drainage, the date, and the time you emptied it. °· Call your health care provider when the drain is putting out less than 10 mL of drainage per day for 2-3 days in a row or as directed by your health care provider. °· Follow your health care provider's instructions for cleaning the drainage tube. You may need to clean the tube every day so that it does not clog. °SEEK MEDICAL CARE IF: °· You have increased bleeding (more than a small spot) from the site where the drainage tube was placed. °· You have redness,  swelling, or increasing pain around the site where the drainage tube was placed. °· You notice a discharge or bad smell coming from the site where the drainage tube was placed. °· You have a fever or chills.  °· You have pain that is not helped by medicine.   °SEEK IMMEDIATE MEDICAL CARE IF: °· There is leakage around the drainage tube. °· The drainage tube pulls out. °· You suddenly stop having drainage from the tube. °· You suddenly have blood in the drainage fluid. °· You become dizzy or faint. °· You develop a rash.   °· You have nausea or vomiting. °· You have difficulty breathing, feel short of breath, or feel faint.   °· You develop chest pain. °· You have problems with your speech or vision. °· You have trouble balancing or moving your arms or legs. °  °This information is not intended to replace advice given to you by your health care provider. Make sure you discuss any questions you have with your health care provider. °  °Document Released: 10/25/2013 Document Revised: 03/29/2014 Document Reviewed: 10/25/2013 °Elsevier Interactive Patient Education ©2016 [employer] ° °

## 2016-01-11 NOTE — Sedation Documentation (Signed)
O2 2l/[place] started 

## 2016-01-11 NOTE — Progress Notes (Signed)
Discharge instructions and home care reviewed with sister.  Understanding voiced as she has already been helping to manage previous drain.

## 2016-01-11 NOTE — H&P (Signed)
Chief Complaint: Patient was seen in consultation today for left transgluteal abscess drain exchange at the request of [name],[name]  Referring Physician(s): Dr Leighton Ruff  Supervising Physician: Sandi Mariscal  Patient Status: Inpatient  History of Present Illness: Chistian Wade is a 70 y.o. male   Rectal Cancer; prostate cancer; colon cancer Pelvic abscess drain placed 12/24/15 Output feculent Flushing daily---leaks from site per sister OP IR Clinic follow up 7/18 CT 7/18 IMPRESSION: 1. Unchanged positioning of left trans gluteal approach percutaneous drainage catheter with and coiled and locked within a minimally decreased though still large presacral air and fluid containing collection. Given size of this residual presacral collection, the decision was made to perform fluoroscopic guided drainage catheter repositioning and up sizing. This will be performed in the next couple days on an outpatient basis at [facility].  Scheduled today for drain replacement/exchange  Past Medical History  Diagnosis Date  . 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 CAD (coronary artery disease) 09/2014    CTO RCA  . HTN (hypertension)   . Prostate cancer ([place])   . Rectal cancer ([place])   . Colon cancer [facility])     Past Surgical History  Procedure Laterality Date  . Knee arthroscopy Left   . Cardiac defibrillator placement  10/25/2014    [employer]  . Cardiac catheterization  10/20/2014    CTO RCA w/ collat, single vessel dz, med rx    Allergies: Review of patient's allergies indicates no known allergies.  Medications: Prior to Admission medications   Medication Sig Start Date End Date Taking? Authorizing Provider  acetaminophen (TYLENOL) 325 MG tablet Take 2 tablets (650 mg total) by mouth every 6 (six) hours as needed for mild pain (or Fever >/= 101). 12/29/15  Yes Clanford L Johnson, MD  DULoxetine (CYMBALTA) 30 MG capsule Take 1 capsule (30 mg total) by mouth daily.  12/18/15  Yes Owens Shark, NP  feeding supplement, ENSURE ENLIVE, (ENSURE ENLIVE) LIQD Take 237 mLs by mouth 2 (two) times daily between meals. 12/29/15  Yes Clanford Marisa Hua, MD  megestrol (MEGACE) 400 MG/10ML suspension Take 5 mLs (200 mg total) by mouth 2 (two) times daily. 12/08/15  Yes Hayden Pedro, PA-C  metoCLOPramide (REGLAN) 10 MG tablet Take 1 tablet (10 mg total) by mouth 4 (four) times daily. 12/19/15  Yes Kyung Rudd, MD  morphine (MS CONTIN) 15 MG 12 hr tablet Take 1 tablet (15 mg total) by mouth every 12 (twelve) hours. 01/02/16  Yes Ladell Pier, MD  oxyCODONE-acetaminophen (PERCOCET) 10-325 MG tablet Take 1 tablet by mouth every 4 (four) hours as needed for pain. 12/29/15  Yes Clanford Marisa Hua, MD  pantoprazole (PROTONIX) 40 MG tablet Take 1 tablet (40 mg total) by mouth daily. 12/18/15 12/17/16 Yes Owens Shark, NP  pyridOXINE (B-6) 50 MG tablet Take 50 mg by mouth daily.   Yes Historical Provider, MD  tamsulosin (FLOMAX) 0.4 MG CAPS capsule Take 1 capsule (0.4 mg total) by mouth daily. 12/29/15  Yes Clanford L Johnson, MD  psyllium (REGULOID) 0.52 g capsule Take 0.52 g by mouth as directed.    Historical Provider, MD     Family History  Problem Relation Age of Onset  . Prostate cancer Father     Social History   Social History  . Marital Status: Single    Spouse Name: N/A  . Number of Children: 0  . Years of Education: N/A   Occupational History  . retired  real estate   Social History Main Topics  . Smoking status: Former Smoker    Types: Cigarettes  . Smokeless tobacco: Never Used     Comment: Smoked from [id number], 1/2 PPD  . Alcohol Use: No  . Drug Use: No  . Sexual Activity: Not Asked   Other Topics Concern  . None   Social History Narrative   Single, no children   Retired from Personal assistant   Lives w/sister and his mom   Ambulates-has walker--FALL RISK    Review of Systems: A 12 point ROS discussed and pertinent positives are indicated  in the HPI above.  All other systems are negative.  Review of Systems  Constitutional: Positive for appetite change. Negative for activity change and fatigue.  Gastrointestinal: Positive for diarrhea. Negative for abdominal pain.  Neurological: Positive for weakness.  Psychiatric/Behavioral: Negative for behavioral problems.    Vital Signs: BP 114/74 mmHg  Pulse 128  Temp(Src) 98.7 F (37.1 C) (Oral)  Resp 16  Ht 5\' 11"  (1.803 m)  Wt 110 lb (49.896 kg)  BMI 15.35 kg/m2  SpO2 100%  Physical Exam  Constitutional: He is oriented to person, place, and time.  Cardiovascular: Normal rate and regular rhythm.   Pulmonary/Chest: Effort normal and breath sounds normal.  Abdominal: Soft. Bowel sounds are normal. There is no tenderness.  Musculoskeletal: Normal range of motion.  Neurological: He is alert and oriented to person, place, and time.  Skin: Skin is warm and dry.  Psychiatric: He has a normal mood and affect. His behavior is normal. Judgment and thought content normal.  Nursing note and vitals reviewed.   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 Patient presents today to the interventional radiology drain Clinic for percutaneous drainage catheter evaluation and management. EXAM: CT ABDOMEN AND PELVIS WITH CONTRAST TECHNIQUE: Multidetector CT imaging of the abdomen and pelvis was performed using the standard protocol following bolus administration of intravenous contrast. CONTRAST:  181mL ISOVUE-300 IOPAMIDOL (ISOVUE-300) INJECTION 61% COMPARISON:  CT scan the  abdomen pelvis - 12/23/2015; CT guided left trans gluteal approach percutaneous drainage catheter placement - 12/24/2015 FINDINGS: Lower chest: Limited visualization of lower thorax demonstrates a minimal amount of linear subsegmental atelectasis within the image right lower lobe. Mild centrilobular emphysematous change. No focal airspace opacities. No pleural effusion. Normal heart size. Pacer leads are seen terminating within the right atrium and ventricle. No pericardial effusion. Hepatobiliary: Normal hepatic contour. Multiple punctate sub cm hypo attenuating lesions are seen scattered within the right and left lobes of the liver, too small to adequately characterize of favored to represent hepatic cysts. There is a minimal amount of focal fatty infiltration adjacent to the fissure for ligamentum teres. No discrete worrisome hepatic lesions. Normal appearance of the gallbladder given degree distention. Borderline enlargement of the common bile duct measuring 7 mm in diameter (coronal image 38, series 601). No intrahepatic biliary duct dilatation. No ascites. Pancreas: Un chain dilatation of the pancreatic duct measuring approximately 5 mm in diameter (coronal image 38, series 601), the etiology of which is not depicted on this examination. No definitive downstream pancreatic atrophy. No peripancreatic stranding. Spleen: Normal appearance  of the spleen. Adrenals/Urinary Tract: There is symmetric enhancement of the bilateral kidneys. The left kidney remains slightly atrophic in comparison to the right. Vascular calcifications are noted about the bilateral splenic hilum. Suspected punctate (approximately 2 mm) nonobstructing stone within the left renal pelvis (coronal image 63, series 601 as well as an additional punctate (approximately 2 mm) nonobstructing stone within the superior pole the left kidney (image 65, series 601). No discrete right-sided renal stones in this postcontrast examination. No discrete renal  lesions. No urine obstruction or perinephric stranding. Normal appearance of the bilateral adrenal glands. The urinary bladder is decompressed with a Foley catheter. Stomach/Bowel: Grossly unchanged positioning of left trans gluteal approach percutaneous drainage catheter with end coiled and locked within the peripheral aspect of the residual air in fluid containing presacral abscess. There has been interval decrease in size of the air in fluid containing presacral abscess post percutaneous drainage catheter placement, currently measuring 10.6 x 3.6 cm (image 61, series 3), previously, 16.6 x 4.5 cm, the collection still remains large. No new definable/ drainable intra-abdominal or pelvic fluid collections. Large colonic stool burden without evidence of enteric obstruction. No pneumoperitoneum, pneumatosis or portal venous gas. Vascular/Lymphatic: Moderate to large amount of eccentric mixed calcified and noncalcified atherosclerotic plaque within the abdominal aorta. Unchanged ectasia of the abdominal aorta measuring 2.7 x 2.9 cm (as measured in greatest oblique sagittal - image 64, series 602 and coronal - image 44, series 601) dimensions respectively. There is grossly unchanged occlusion involving the imaged portions of the bilateral superficial femoral arteries, right greater than left (images 79 and 86, series 3). No bulky retroperitoneal, mesenteric, pelvic or inguinal lymphadenopathy. Reproductive: Normal appearance of the prostate gland. Other: Mild diffuse body wall anasarca. Musculoskeletal: No acute or aggressive osseous abnormalities. Moderate DDD of L5-S1 with disc space height loss, endplate irregularity and sclerosis. IMPRESSION: 1. Unchanged positioning of left trans gluteal approach percutaneous drainage catheter with and coiled and locked within a minimally decreased though still large presacral air and fluid containing collection. Given size of this residual presacral collection, the decision was  made to perform fluoroscopic guided drainage catheter repositioning and up sizing. This will be performed in the next couple days on an outpatient basis at [facility]. 2. Unchanged dilatation of the pancreatic duct and borderline enlargement of the common bile duct, the etiology of which is not depicted on this examination. Further evaluation with MRCP could be performed as clinically indicated. 3. Decompression of the urinary bladder post Foley catheter placement. 4. Suspected punctate (approximately 2 mm) nonobstructing left-sided nephrolithiasis. 5. 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 Correlation for symptoms of PAD is recommended. 6. Unchanged ectasia of the infrarenal abdominal aorta measuring 2.9 cm in diameter. 7.  Emphysema. [id number]) Electronically Signed   By: Sandi Mariscal M.D.   On: 01/09/2016 12:58   Ct Abdomen Pelvis W Contrast  12/23/2015  CLINICAL DATA:  Abdominal pain and diarrhea for a few months. 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 EXAM: CT ABDOMEN AND PELVIS WITH CONTRAST TECHNIQUE: Multidetector CT imaging of the abdomen and pelvis was performed using the standard protocol following bolus administration of intravenous contrast. CONTRAST:  59mL ISOVUE-300 IOPAMIDOL (ISOVUE-300) INJECTION 61% COMPARISON:  Outside CT from 11/03/2015.  Outside CT from 08/06/2014 FINDINGS: Lower chest:  Centrilobular emphysema noted in the lung bases. Hepatobiliary: Scattered tiny hypodensities in the liver parenchyma are  stable. No enhancing lesion is identified within the liver parenchyma. There is no evidence for gallstones, gallbladder wall thickening, or pericholecystic fluid. Mild intrahepatic biliary duct dilatation has progressed slightly in the interval. Common bile duct in the head of the pancreas measures 8 mm in diameter  today compared to 6 mm on 11/03/2015. Pancreas: Slight prominence of the main pancreatic duct, stable. Spleen: No splenomegaly. No focal mass lesion. Adrenals/Urinary Tract: No adrenal nodule or mass. Atrophic left kidney. No evidence for enhancing renal lesion with evidence of decreased perfusion of the left kidney. No evidence for hydroureter. Bladder is markedly distended. Stomach/Bowel: Stomach is nondistended. No gastric wall thickening. No evidence of outlet obstruction. Duodenum is normally positioned as is the ligament of Treitz. Small bowel loops are nondilated. Terminal ileum unremarkable. The appendix is not visualized, but there is no edema or inflammation in the region of the cecum. Colon is diffusely distended and stool-filled from the cecum to the rectum. Vascular/Lymphatic: There is abdominal aortic atherosclerosis without aneurysm. Abdominal aorta measures up to 2.9 cm in diameter. No evidence for gastrohepatic or hepatoduodenal ligament lymphadenopathy. No abdominal retroperitoneal lymphadenopathy on the current study. No evidence for pelvic sidewall lymphadenopathy. Reproductive: The prostate gland and seminal vesicles have normal imaging features. Other: A large collection of fluid, debris, and gas is identified in the presacral space, between the rectum and the sacrum. The main component of this complex process measures 13.5 cm in coronal diameter by 9 cm in craniocaudal diameter by 4.5 cm in AP diameter. There is extension of this process posteriorly in the right gluteus musculature, behind the right acetabulum. Gas dissects around to the right side of the rectum, displacing the rectum to the left. There is associated wall thickening in the rectum and perirectal edema/ inflammation. Musculoskeletal: Bone windows reveal no worrisome lytic or sclerotic osseous lesions. IMPRESSION: 1. Large complex collection of gas, debris, and fluid in the presacral space compatible with abscess. This dissects  down around to the right of the rectum and laterally into the right gluteus musculature behind the right acetabulum. No direct communication to the rectal lumen is evident, but features are probably related to rectal perforation in this patient with a history of rectal carcinoma. 2. Markedly distended urinary bladder with the dome of the bladder cranial to the umbilicus. Imaging features are compatible with urinary retention. 3. Atrophic left kidney. 4. Scattered low-density liver lesions stable comparing back to the oldest outside imaging study of 08/06/2014. Imaging findings of potential clinical significance: Emphysema. [id number]) Aortic Atherosclerosis (ICD10-170.0) Electronically Signed   By: Misty Stanley M.D.   On: 12/23/2015 19:36   Dg Abd Acute W/chest  12/23/2015  CLINICAL DATA:  Abdominal pain for a few months. EXAM: DG ABDOMEN ACUTE W/ 1V CHEST COMPARISON:  No comparison studies available. FINDINGS: The lungs are clear wiithout focal pneumonia, edema, pneumothorax or pleural effusion. The cardiopericardial silhouette is within normal limits for size. Left-sided pacer/AICD noted. A right Port-A-Cath is visualized with distal tip position overlying the distal SVC. Old posterior right rib fractures noted. Right-side-up decubitus film shows no evidence for intraperitoneal free air. Supine view the abdomen shows no gaseous bowel dilatation suggest obstruction. Prominent stool volume is seen along the length of the colon. Bones are diffusely demineralized. IMPRESSION: 1. No acute cardiopulmonary findings. 2. No evidence for bowel perforation or obstruction. 3. Large colonic stool volume. Imaging features could be compatible with clinical constipation. Electronically Signed   By: Misty Stanley M.D.   On: 12/23/2015 16:53  Ct Image Guided Drainage Percut Cath  Peritoneal Retroperit  12/24/2015  CLINICAL DATA:  Rectal carcinoma, post perforation, with large presacral abscess EXAM: CT GUIDED DRAINAGE OF  PELVIC PRESACRAL ABSCESS ANESTHESIA/SEDATION: Intravenous Fentanyl and Versed were administered as conscious sedation during continuous monitoring of the patient's level of consciousness and physiological / cardiorespiratory status by the radiology RN, with a total moderate sedation time of 12 minutes. PROCEDURE: The procedure, risks, benefits, and alternatives were explained to the patient. Questions regarding the procedure were encouraged and answered. The patient understands and consents to the procedure. Patient placed prone. Select axial scans through the pelvis obtained. An appropriate skin entry site was determined and marked. The operative field was prepped with chlorhexidinein a sterile fashion, and a sterile drape was applied covering the operative field. A sterile gown and sterile gloves were used for the procedure. Local anesthesia was provided with 1% Lidocaine. Under CT fluoroscopic guidance, a 19 gauge percutaneous entry needle was advanced into the collection from a rib left trans gluteal approach. Purulent material spontaneously returned through the needle lobe. An Amplatz wire advanced easily within the collection, its position confirmed on CT. Tract was dilated to facilitate placement of a 12 French pigtail catheter, placed centrally within the collection. 60 mL purulent material were aspirated, sent for routine Gram stain and culture. Catheter secured externally with 0 Prolene suture and stat lock, and placed to gravity drain bag. The patient tolerated the procedure well. COMPLICATIONS: None immediate FINDINGS: Complex presacral gas and fluid collecting dissecting into the deep right gluteal muscular planes again identified. CT-guided abscess drain catheter placement from a left-sided approach was performed. IMPRESSION: 1. Technically successful CT-guided presacral pelvic abscess drain catheter placement Electronically Signed   By: Lucrezia Europe M.D.   On: 12/24/2015 14:06     Labs:  CBC:  Recent Labs  12/26/15 1015 12/27/15 0450 12/28/15 0400 12/29/15 0450  WBC 8.0 9.2 9.1 10.8*  HGB 9.4* 9.7* 10.0* 9.2*  HCT 26.5* 27.4* 29.0* 26.2*  PLT 237 285 275 259    COAGS:  Recent Labs  12/24/15 0528  INR 1.46    BMP:  Recent Labs  12/25/15 0445 12/26/15 0435 12/27/15 0450 12/28/15 0400  NA 131* 133* 131* 133*  K 2.6* 3.3* 3.9 4.5  CL 102 102 100* 102  CO2 22 25 26 27   GLUCOSE 111* 114* 100* 101*  BUN 11 12 11 11   CALCIUM 7.7* 8.0* 8.2* 8.4*  CREATININE 0.81 0.73 0.77 0.69  GFRNONAA >60 >60 >60 >60  GFRAA >60 >60 >60 >60    LIVER FUNCTION TESTS:  Recent Labs  12/18/15 1349 12/23/15 1605 12/24/15 0528 12/25/15 0445  BILITOT 0.70 0.9 0.9 0.9  AST 11 17 13* 12*  ALT 10 13* 10* 9*  ALKPHOS 117 98 83 63  PROT 7.1 7.0 6.3* 5.2*  ALBUMIN 2.6* 2.7* 2.4* 2.0*    TUMOR MARKERS: No results for input(s): AFPTM, CEA, CA199, CHROMGRNA in the last 8760 hours.  Assessment and Plan:  Pelvic abscess drain Leaking at site CT reveals no change in collection Scheduled now for drain replacement/exchnage Pt is aware of procedure risks and benefits; including but not limited to Infection, bleeding; damage to surrounding structure Agreeable to proceed Consent signed andin chart  Thank you for this interesting consult.  I greatly enjoyed meeting Nahum Biagini and look forward to participating in their care.  A copy of this report was sent to the requesting provider on this date.  Electronically Signed: Lavonia Drafts 01/11/2016,  8:58 AM   I spent a total of  30 Minutes   in face to face in clinical consultation, greater than 50% of which was counseling/coordinating care for pelvic abscess drain exchange

## 2016-01-12 ENCOUNTER — Ambulatory Visit: Payer: Medicare (Managed Care)

## 2016-01-14 NOTE — Addendum Note (Signed)
Encounter addended by: Kyung Rudd, MD on: 01/14/2016 12:21 AM<BR>     Documentation filed: Notes Section, Visit Diagnoses

## 2016-01-14 NOTE — Progress Notes (Signed)
  Radiation Oncology         [phone] ________________________________  Name: Michael Wade MRN: [id number]  Date: 11/21/2015  DOB: 1946-02-12  SIMULATION AND TREATMENT PLANNING NOTE  DIAGNOSIS:     ICD-9-CM ICD-10-CM   1. Prostate cancer ([place]) 185 C61      Site:  Rectum/prostate with the patient having synchronous cancers in this region  NARRATIVE:  The patient was brought to the [place].  Identity was confirmed.  All relevant records and images related to the planned course of therapy were reviewed.   Written consent to proceed with treatment was confirmed which was freely given after reviewing the details related to the planned course of therapy had been reviewed with the patient.  Then, the patient was set-up in a stable reproducible  supine position for radiation therapy.  CT images were obtained.  Surface markings were placed.    Medically necessary complex treatment device(s) for immobilization:  Customized vac lock bag.   The CT images were loaded into the planning software.  Then the target and avoidance structures were contoured.  Treatment planning then occurred.  The radiation prescription was entered and confirmed.  A total of 2 complex treatment devices were fabricated which relate to the designed radiation treatment fields. Each of these customized fields/ complex treatment devices will be used on a daily basis during the radiation course. I have requested : Intensity Modulated Radiotherapy (IMRT) is medically necessary for this case for the following reason:  Small bowel/bladder sparing..   The patient will undergo daily image guidance to ensure accurate localization of the target, and adequate minimize dose to the normal surrounding structures in close proximity to the target.   PLAN:  The patient will receive 45 Gy in 25 fractions to the rectum and 55 gray in 25 fractions to the prostate simultaneously. It is anticipated that the patient will then  receive a boost to these regions as well.   Special treatment procedure The patient will also receive concurrent chemotherapy during the treatment. The patient may therefore experience increased toxicity or side effects and the patient will be monitored for such problems. This may require extra lab work as necessary. This therefore constitutes a special treatment procedure. .  ________________________________   Jodelle Gross, MD, PhD

## 2016-01-15 ENCOUNTER — Ambulatory Visit: Payer: Medicare (Managed Care)

## 2016-01-15 ENCOUNTER — Encounter: Payer: Self-pay | Admitting: *Deleted

## 2016-01-15 ENCOUNTER — Telehealth: Payer: Self-pay | Admitting: Oncology

## 2016-01-15 ENCOUNTER — Ambulatory Visit (HOSPITAL_BASED_OUTPATIENT_CLINIC_OR_DEPARTMENT_OTHER): Payer: 59 | Admitting: Nurse Practitioner

## 2016-01-15 VITALS — BP 117/71 | HR 88 | Temp 98.4°F | Resp 18 | Ht 71.0 in | Wt 101.3 lb

## 2016-01-15 DIAGNOSIS — C7800 Secondary malignant neoplasm of unspecified lung: Secondary | ICD-10-CM | POA: Diagnosis not present

## 2016-01-15 DIAGNOSIS — D509 Iron deficiency anemia, unspecified: Secondary | ICD-10-CM

## 2016-01-15 DIAGNOSIS — G893 Neoplasm related pain (acute) (chronic): Secondary | ICD-10-CM

## 2016-01-15 DIAGNOSIS — C61 Malignant neoplasm of prostate: Secondary | ICD-10-CM

## 2016-01-15 DIAGNOSIS — C20 Malignant neoplasm of rectum: Secondary | ICD-10-CM

## 2016-01-15 NOTE — Progress Notes (Addendum)
[place] OFFICE PROGRESS NOTE   Diagnosis:  Rectal cancer, prostate cancer  INTERVAL HISTORY:   Michael Wade returns for follow-up. He is enrolled in the [facility] hospice program. Follow-up CT abdomen/pelvis 01/09/2016 showed a still large presacral air and fluid containing collection. He underwent exchange of the percutaneous drainage catheter on 01/11/2016.  He denies fever. No chills. His sister empties the drainage bag once a day. She describes the volume as "small". Appetite varies. Abdominal pain is fairly well controlled. He continues MS Contin twice a day and takes Percocet twice a day as needed.  Objective:  Vital signs in last 24 hours:  Blood pressure 117/71, pulse 88, temperature 98.4 F (36.9 C), temperature source Oral, resp. rate 18, height 5\' 11"  (1.803 m), weight 101 lb 4.8 oz (45.9 kg), SpO2 98 %.    HEENT: No thrush or ulcers. Resp: Lungs clear bilaterally. Cardio: Regular rate and rhythm. GI: Abdomen soft and nontender. Percutaneous drain left buttock. Site is covered with a bandage which was not removed. Vascular: No leg edema. Neuro: Alert and oriented.  Port-A-Cath without erythema.    Lab Results:  Lab Results  Component Value Date   WBC 19.7 (H) 01/11/2016   HGB 10.3 (L) 01/11/2016   HCT 31.0 (L) 01/11/2016   MCV 92.8 01/11/2016   PLT 363 01/11/2016   NEUTROABS 16.2 (H) 12/18/2015    Imaging:  No results found.  Medications: I have reviewed the patient's current medications.  Assessment/Plan: 1. Rectal cancer, stage IV, a lung metastasis and Metastatic lymphadenopathy on initial staging), status post A rectal biopsy 08/07/2015 , CEA 2.8 on 09/22/2014 ? colonoscopy 08/06/2014 confirmed a mass extending from 7-18 cm from the anus ? MRI 08/19/2014 revealed a circumferential mass at the rectum, no definite extension beyond the serosal margin, no perirectal adenopathy ? PET scan 09/30/2014 with an FDG avid upper rectal mass,  extension into adjacent mesorectal fat versus mesorectal lymphadenopathy, FDG avid left retroperitoneal lymph nodes and an FDG avid Right upper lung groundglass nodule measuring 1.9 x 1.1 cm. ? CT-guided biopsy of the right lung nodule 10/12/2014 confirmed metastatic colorectal cancer ? 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 restaging PET scan 02/23/2015 after 6 cycles revealed local regional response and the right upper lobe nodule had almost completely resolved ? PET scan 06/12/2015 after cycle 12 revealed continued improvement in the rectal mass, hypermetabolic Retroperitoneal lymphadenopathy no longer seen, hyper metabolic activity at right lung nodule had resolved ? one cycle of Xeloda January 2017 ? CTs chest, abdomen, and pelvis 11/03/2015-clustered peripheral right upper lobe nodules, no other evidence of metastatic disease, rectal mass with large colonic stool burden, 1.6 cm presacral rim-enhancing fluid collection ? Initiation of radiation/Xeloda 11/30/2015, last treated on 12/21/2015 ? Hospitalized 12/23/2015 through 12/29/2015 with a pelvic abscess and urinary retention. Pelvic drain placed. Diverting colostomy recommended by surgery. Felt to have a poor prognosis in the absence of surgery for the rectal cancer. He declined surgery. ? Treatment discontinued. He is enrolled in the hospice program.   2. Prostate cancer, PSA 11.3 09/05/2014   Prostate biopsy 09/05/2014 confirmed a Gleason 7 tumor  maintained on every three-month Lupron starting 11/23/2014   3. Iron deficiency anemia  4. 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 History of CAD, EF 15%  6. Hypertension 7. 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 Family history of colon and pancreas cancer 9.  Pelvic abscess confirmed on CT 12/23/2015-percutaneous drain placed 12/24/2015; follow-up CT 01/09/2016 showed a still large presacral air and fluid containing collection. The drainage catheter was exchanged on 01/11/2016.  10. Urinary retention, status post placement of a Foley catheter, potentially related to prostate cancer   Disposition: Michael Wade is being followed on a supportive/comfort care approach. He is enrolled in the [facility] hospice program.   Pain appears to be overall well-controlled with a combination of MS Contin and Percocet as needed. He will continue the same.  With regard to the pelvic abscess he is scheduled for a follow-up CT scan 01/25/2016.  We scheduled a return visit here in approximately 4 weeks. He will contact the office in the interim with any problems.  Patient seen with Dr. Benay Spice.      Ned Card ANP/GNP-BC   01/15/2016  12:53 PM   This was a shared visit with Ned Card. Michael Wade decided to enroll in Hospice care. He will be followed with comfort care.  Julieanne Manson, M.D.

## 2016-01-15 NOTE — Telephone Encounter (Signed)
left msg confirming 8/22 apt time

## 2016-01-15 NOTE — Progress Notes (Signed)
Oncology Nurse Navigator Documentation  Oncology Nurse Navigator Flowsheets 01/15/2016  Navigator Location [facility]Med Onc  Navigator Encounter Type Follow-up Appt  Telephone -  Patient Visit Type MedOnc  Treatment Phase Other--HOSPICE care  Barriers/Navigation Needs Family concerns  Education -  Interventions Other--obtained velcro strip needed to attach to the drainage back for his abscess drain to allow him to support bag around his waist or around his leg-he prefers to use it around his waist. Demonstrated to his sister how to apply.  Referrals -  Coordination of Care -  Education Method -  Acuity -  Time Spent with Patient 15

## 2016-01-16 ENCOUNTER — Ambulatory Visit: Payer: Medicare (Managed Care)

## 2016-01-17 ENCOUNTER — Other Ambulatory Visit: Payer: Self-pay | Admitting: Radiation Oncology

## 2016-01-18 ENCOUNTER — Other Ambulatory Visit: Payer: Self-pay | Admitting: *Deleted

## 2016-01-18 ENCOUNTER — Telehealth: Payer: Self-pay | Admitting: *Deleted

## 2016-01-18 MED ORDER — OXYCODONE-ACETAMINOPHEN 10-325 MG PO TABS: 2.0000 | ORAL_TABLET | Freq: Four times a day (QID) | ORAL | 0 refills | Status: DC | PRN

## 2016-01-18 NOTE — Telephone Encounter (Signed)
Call received from [place] to clarify patients Percocet dose.  Per Shirlean Mylar, pt is taking Percocet 10/325 two tablets every six hours-PRN and is in need of a refill and is requesting script to be sent to [facility].  Will notify Dr. Benay Spice.

## 2016-01-19 ENCOUNTER — Telehealth: Payer: Self-pay | Admitting: *Deleted

## 2016-01-19 NOTE — Telephone Encounter (Signed)
Call received from Vicie Mutters with [facility] asking for order to d/c foley and attempt voiding trial with pt.  Per Dr. Benay Spice, [place] for voiding trial and to d/c foley.  Notified Robin with MD orders.

## 2016-01-22 ENCOUNTER — Ambulatory Visit: Payer: Medicare (Managed Care) | Admitting: Radiation Oncology

## 2016-01-23 ENCOUNTER — Ambulatory Visit: Payer: Medicare (Managed Care)

## 2016-01-23 ENCOUNTER — Telehealth: Payer: Self-pay | Admitting: *Deleted

## 2016-01-23 NOTE — Telephone Encounter (Signed)
Message from Gordy Savers, [facility] RN requesting clarification on Foley catheter orders. Pt had Foley placed due to urinary retention. Urologist recommended removing catheter for voiding trial. Pt is requesting to leave Foley catheter in place as it was painful to insert. Reviewed with Dr. Benay Spice: Leave Foley in place, change PRN only.  Called order to Gordy Savers, RN.

## 2016-01-24 ENCOUNTER — Ambulatory Visit
Admission: RE | Admit: 2016-01-24 | Discharge: 2016-01-24 | Disposition: A | Discharge status: Home / Self Care | Payer: 59 | Source: Ambulatory Visit | Attending: General Surgery | Admitting: General Surgery

## 2016-01-24 ENCOUNTER — Other Ambulatory Visit: Payer: Self-pay | Admitting: Radiology

## 2016-01-24 ENCOUNTER — Ambulatory Visit
Admission: RE | Admit: 2016-01-24 | Discharge: 2016-01-24 | Disposition: A | Discharge status: Home / Self Care | Payer: 59 | Source: Ambulatory Visit | Attending: Radiology | Admitting: Radiology

## 2016-01-24 ENCOUNTER — Ambulatory Visit (HOSPITAL_COMMUNITY)
Admission: RE | Admit: 2016-01-24 | Discharge: 2016-01-24 | Disposition: A | Discharge status: Home / Self Care | Source: Ambulatory Visit | Attending: Radiology | Admitting: Radiology

## 2016-01-24 ENCOUNTER — Encounter (HOSPITAL_COMMUNITY): Payer: Self-pay | Admitting: Interventional Radiology

## 2016-01-24 ENCOUNTER — Other Ambulatory Visit: Payer: Self-pay | Admitting: General Surgery

## 2016-01-24 DIAGNOSIS — K611 Rectal abscess: Secondary | ICD-10-CM

## 2016-01-24 DIAGNOSIS — C61 Malignant neoplasm of prostate: Secondary | ICD-10-CM | POA: Diagnosis not present

## 2016-01-24 DIAGNOSIS — Z9221 Personal history of antineoplastic chemotherapy: Secondary | ICD-10-CM | POA: Insufficient documentation

## 2016-01-24 DIAGNOSIS — Z4803 Encounter for change or removal of drains: Secondary | ICD-10-CM | POA: Diagnosis not present

## 2016-01-24 DIAGNOSIS — IMO0002 Reserved for concepts with insufficient information to code with codable children: Secondary | ICD-10-CM

## 2016-01-24 DIAGNOSIS — C20 Malignant neoplasm of rectum: Secondary | ICD-10-CM | POA: Insufficient documentation

## 2016-01-24 DIAGNOSIS — Z923 Personal history of irradiation: Secondary | ICD-10-CM | POA: Diagnosis not present

## 2016-01-24 DIAGNOSIS — K651 Peritoneal abscess: Secondary | ICD-10-CM | POA: Diagnosis not present

## 2016-01-24 HISTORY — PX: IR GENERIC HISTORICAL: IMG1180011

## 2016-01-24 MED ORDER — IOPAMIDOL (ISOVUE-300) INJECTION 61%
INTRAVENOUS | Status: AC
Start: 2016-01-24 — End: 2016-01-24
  Administered 2016-01-24: 10 mL
  Filled 2016-01-24: qty 50

## 2016-01-24 MED ORDER — IOPAMIDOL (ISOVUE-300) INJECTION 61%
100.0000 mL | Freq: Once | INTRAVENOUS | Status: AC | PRN
  Administered 2016-01-24: 100 mL via INTRAVENOUS

## 2016-01-24 MED ORDER — LIDOCAINE HCL 1 % IJ SOLN
INTRAMUSCULAR | Status: AC
  Administered 2016-01-24: 10 mL
  Filled 2016-01-24: qty 20

## 2016-01-24 NOTE — Progress Notes (Signed)
Referring Physician(s): Dr. Leighton Ruff  Supervising Physician: Marybelle Killings  Patient Status:  Outpatient   Subjective: Presacral abscess s/p perc drain/upsize here for follow up CT and eval. Pt and family report very little coming out via drain, but there is drainage and what appears to be stool coming through and around the drain itself. He had CT today which was reviewed by Dr. Barbie Banner. This shows that the drain has partially retracted from the cavity.  Allergies: Review of patient's allergies indicates no known allergies.  Medications: Prior to Admission medications   Medication Sig Start Date End Date Taking? Authorizing Provider  acetaminophen (TYLENOL) 325 MG tablet Take 2 tablets (650 mg total) by mouth every 6 (six) hours as needed for mild pain (or Fever >/= 101). 12/29/15   Clanford L Johnson, MD  DULoxetine (CYMBALTA) 30 MG capsule Take 1 capsule (30 mg total) by mouth daily. 12/18/15   Owens Shark, NP  feeding supplement, ENSURE ENLIVE, (ENSURE ENLIVE) LIQD Take 237 mLs by mouth 2 (two) times daily between meals. 12/29/15   Clanford Marisa Hua, MD  megestrol (MEGACE) 400 MG/10ML suspension Take 5 mLs (200 mg total) by mouth 2 (two) times daily. 12/08/15   Hayden Pedro, PA-C  metoCLOPramide (REGLAN) 10 MG tablet Take 1 tablet (10 mg total) by mouth 4 (four) times daily. 12/19/15   Kyung Rudd, MD  morphine (MS CONTIN) 15 MG 12 hr tablet Take 1 tablet (15 mg total) by mouth every 12 (twelve) hours. 01/02/16   Ladell Pier, MD  oxyCODONE-acetaminophen (PERCOCET) 10-325 MG tablet Take 2 tablets by mouth every 6 (six) hours as needed for pain. 01/18/16   Ladell Pier, MD  pantoprazole (PROTONIX) 40 MG tablet Take 1 tablet (40 mg total) by mouth daily. 12/18/15 12/17/16  Owens Shark, NP  psyllium (REGULOID) 0.52 g capsule Take 0.52 g by mouth as directed.    Historical Provider, MD  pyridOXINE (B-6) 50 MG tablet Take 50 mg by mouth daily.    Historical Provider, MD    tamsulosin (FLOMAX) 0.4 MG CAPS capsule Take 1 capsule (0.4 mg total) by mouth daily. 12/29/15   Clanford Marisa Hua, MD     Vital Signs: There were no vitals taken for this visit.  Physical Exam Drain remains intact though multiple side holes are visible. Feculent material is coming through tube and some around the tract as well.   Imaging: No results found.  Labs:  CBC:  Recent Labs  12/27/15 0450 12/28/15 0400 12/29/15 0450 01/11/16 0854  WBC 9.2 9.1 10.8* 19.7*  HGB 9.7* 10.0* 9.2* 10.3*  HCT 27.4* 29.0* 26.2* 31.0*  PLT 285 275 259 363    COAGS:  Recent Labs  12/24/15 0528 01/11/16 0854  INR 1.46 1.16    BMP:  Recent Labs  12/25/15 0445 12/26/15 0435 12/27/15 0450 12/28/15 0400  NA 131* 133* 131* 133*  K 2.6* 3.3* 3.9 4.5  CL 102 102 100* 102  CO2 22 25 26 27   GLUCOSE 111* 114* 100* 101*  BUN 11 12 11 11   CALCIUM 7.7* 8.0* 8.2* 8.4*  CREATININE 0.81 0.73 0.77 0.69  GFRNONAA >60 >60 >60 >60  GFRAA >60 >60 >60 >60    LIVER FUNCTION TESTS:  Recent Labs  12/18/15 1349 12/23/15 1605 12/24/15 0528 12/25/15 0445  BILITOT 0.70 0.9 0.9 0.9  AST 11 17 13* 12*  ALT 10 13* 10* 9*  ALKPHOS 117 98 83 63  PROT 7.1 7.0 6.3*  5.2*  ALBUMIN 2.6* 2.7* 2.4* 2.0*    Assessment and Plan: Presacral abscess s/p perc drain and then upsizing on 7/20 Drain is not functional at this time due to partial retraction on exam and imaging. Needs drain exchange and have worked him into schedule today at [facility]. Instructions given to family.  Electronically Signed: Ascencion Dike 01/24/2016, 10:34 AM   I spent a total of 15 Minutes at the the patient's bedside AND on the patient's hospital floor or unit, greater than 50% of which was counseling/coordinating care for presacral abscess drain

## 2016-01-24 NOTE — Procedures (Signed)
Technically successful fluoroscopic guided exchange of a new 14 Fr L TG approach percutaneous drain with end coiled and locked within the midline of the lower pelvis.  No immediate post procedural complications.  Ronny Bacon, MD Pager #: [phone]

## 2016-01-25 ENCOUNTER — Other Ambulatory Visit: Payer: 59

## 2016-01-29 ENCOUNTER — Telehealth: Payer: Self-pay | Admitting: *Deleted

## 2016-01-29 MED ORDER — MORPHINE SULFATE ER 30 MG PO TBCR: 30.0000 mg | EXTENDED_RELEASE_TABLET | Freq: Two times a day (BID) | ORAL | 0 refills | Status: DC

## 2016-01-29 NOTE — Telephone Encounter (Signed)
Late entry for 01/26/16: Call from Gordy Savers, Hospice RN reporting pt is taking Oxycodone 10/325 Q 4hours around the clock for pain relief. Asking to increase long acting pain med dose. Discussed with Dr. Benay Spice: Order received to increase MS Contin dose to 30 mg Q12 hours.   Rx will be faxed to pharmacy

## 2016-02-02 ENCOUNTER — Telehealth: Payer: Self-pay | Admitting: *Deleted

## 2016-02-02 NOTE — Telephone Encounter (Signed)
Call received from [facility] with [facility] requesting something for pt.'s mouth.  She states that pt has a sore inside of his mouth at one of his back molars which is causing him some discomfort.  Per Dr. Benay Spice, pt may use oragel as needed.  Carla notified and will inform patient of MD orders.

## 2016-02-02 NOTE — Telephone Encounter (Signed)
Message received from pt.'s sister stating that pt has an "abscess on his jaw that is swollen and painful".  Call placed back to pt.'s sister and she states that abscess on pt.'s jaw is approximately the size of a "pinky fingernail".  She states that pt is having difficulty opening his mouth and that OTC mouth sore rinses are not helping at this time.  She does state that Percocet is relieving the pain.  Dr. Benay Spice notified and order received to call pt.'s sister back and have hospice nurse assess area and call MD if intervention is needed.  Second call placed to patient's sister with MD instructions and she states that Hospice nurse will be seeing pt this afternoon.

## 2016-02-07 ENCOUNTER — Inpatient Hospital Stay: Admission: RE | Admit: 2016-02-07 | Payer: 59 | Source: Ambulatory Visit

## 2016-02-07 ENCOUNTER — Other Ambulatory Visit: Payer: 59

## 2016-02-07 ENCOUNTER — Inpatient Hospital Stay
Admission: RE | Admit: 2016-02-07 | Discharge: 2016-02-07 | Disposition: A | Discharge status: Home / Self Care | Payer: 59 | Source: Ambulatory Visit | Attending: General Surgery | Admitting: General Surgery

## 2016-02-08 ENCOUNTER — Telehealth: Payer: Self-pay | Admitting: *Deleted

## 2016-02-08 MED ORDER — MORPHINE SULFATE (CONCENTRATE) 20 MG/ML PO SOLN: 10.0000 mg | ORAL | 0 refills | Status: AC | PRN

## 2016-02-08 NOTE — Telephone Encounter (Signed)
Call received from "[facility] with Hospice [phone]) requesting order for DNR.  Our doctor can sign.  Patient is transitioning, he and family let us know they want DNR status."  Will notify provider of this request.

## 2016-02-08 NOTE — Telephone Encounter (Signed)
Returned call to hospice RN. Verbal order given for DNR, OK for hospice MD to sign, per Dr. Benay Spice. Ria Comment reports pt is bedbound, weak and has not eaten in 3 days. It is becoming difficult for pt to swallow pills. Requesting order for Roxanol. Reviewed with Dr. Benay Spice: order received and faxed to [facility].

## 2016-02-09 ENCOUNTER — Telehealth: Payer: Self-pay | Admitting: Oncology

## 2016-02-09 NOTE — Telephone Encounter (Signed)
02/13/2016 Appointment canceled per patient request. Will not be able to attend appointment.

## 2016-02-12 ENCOUNTER — Telehealth: Payer: Self-pay | Admitting: *Deleted

## 2016-02-12 NOTE — Telephone Encounter (Signed)
[facility] called inquiring if Dr. Benay Spice will sign death certificate before we bring this all the way over there."  Dr. Benay Spice informed this nurse he will.  Instructed home to bring form to [facility] On second floor.

## 2016-02-13 ENCOUNTER — Ambulatory Visit: Payer: 59 | Admitting: Oncology

## 2016-02-13 ENCOUNTER — Other Ambulatory Visit: Payer: 59

## 2016-02-13 ENCOUNTER — Inpatient Hospital Stay: Admission: RE | Admit: 2016-02-13 | Payer: 59 | Source: Ambulatory Visit

## 2016-02-15 ENCOUNTER — Encounter: Payer: Self-pay | Admitting: Internal Medicine

## 2016-02-23 DEATH — deceased

## 2016-02-29 ENCOUNTER — Encounter: Payer: Managed Care, Other (non HMO) | Admitting: Internal Medicine

## 2016-03-22 ENCOUNTER — Encounter: Payer: Self-pay | Admitting: Interventional Radiology

## 2016-06-07 ENCOUNTER — Other Ambulatory Visit: Payer: Self-pay | Admitting: Nurse Practitioner

## 2016-06-27 ENCOUNTER — Encounter: Payer: Self-pay | Admitting: Radiology

## 2017-05-27 IMAGING — XA IR CATHETER TUBE CHANGE
2 series · 8 of 8 positions shown · non-contrast
Comparison: Fluoroscopic guided percutaneous drainage catheter
exchange, conversion and up sizing - 01/11/2016;

CLINICAL DATA: History of prostate and rectal cancer with ongoing
chemo radiation complicated by development of a large presacral
abscess.

[Series 1: fl (-) angio · 2 of 2 slices shown (1 of 2)]
[im 1/2]
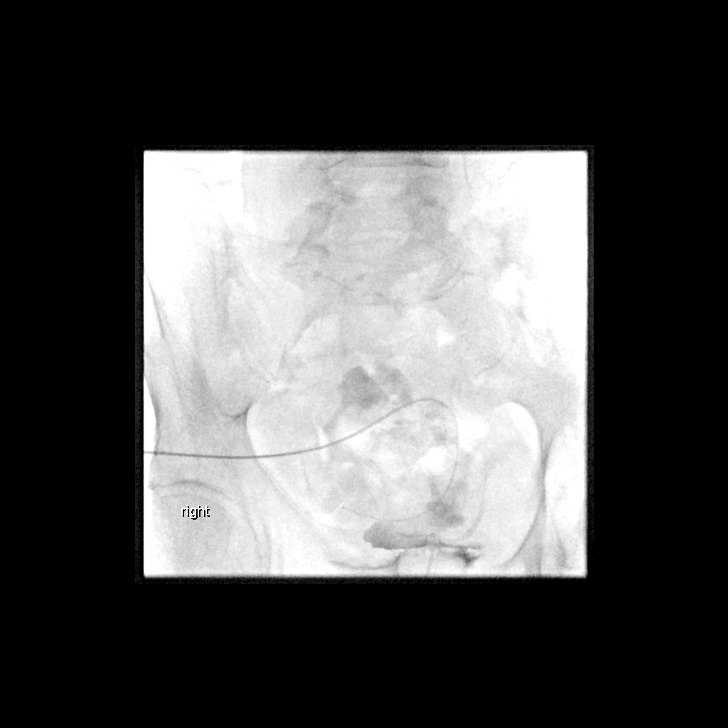
[im 2/2]
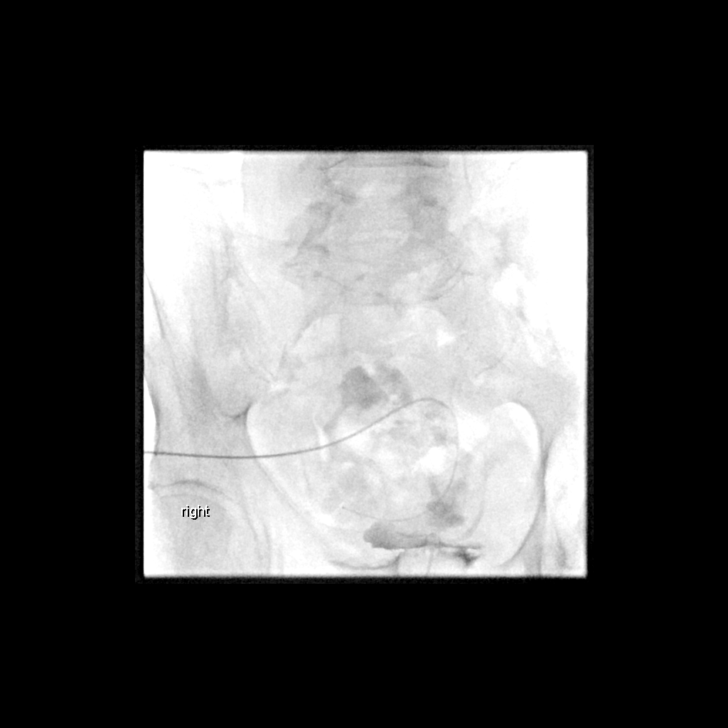

[Series 2: fl (-) angio · 6 of 6 slices shown (2 of 2)]
[im 1/6]
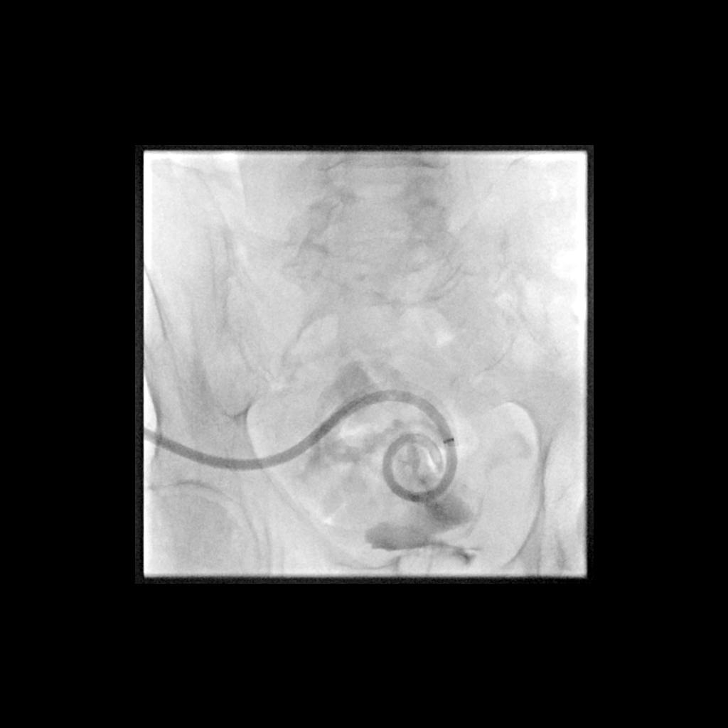
[im 2/6]
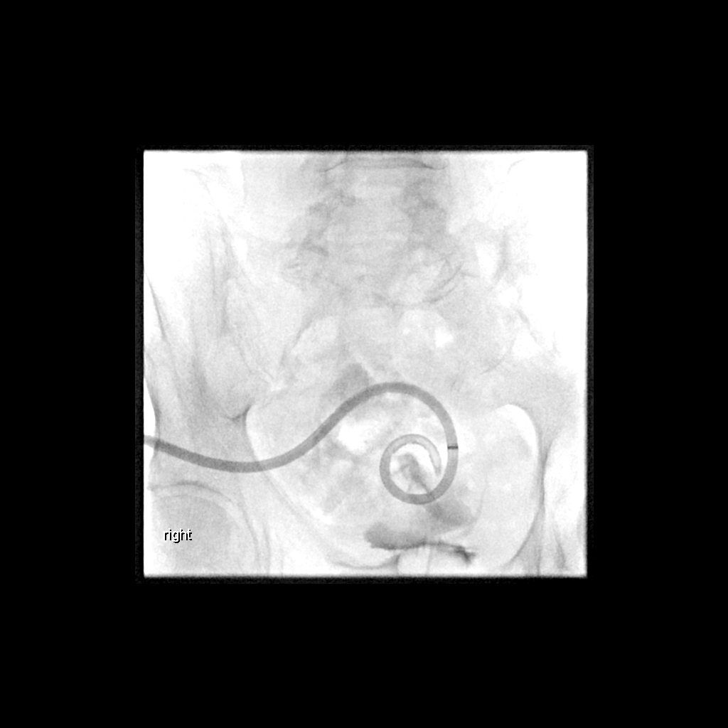
[im 3/6]
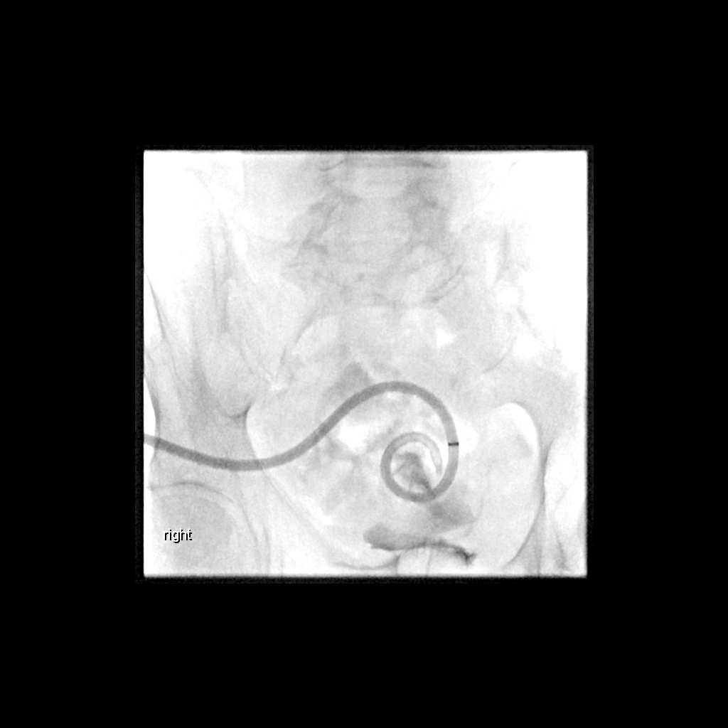
[im 4/6]
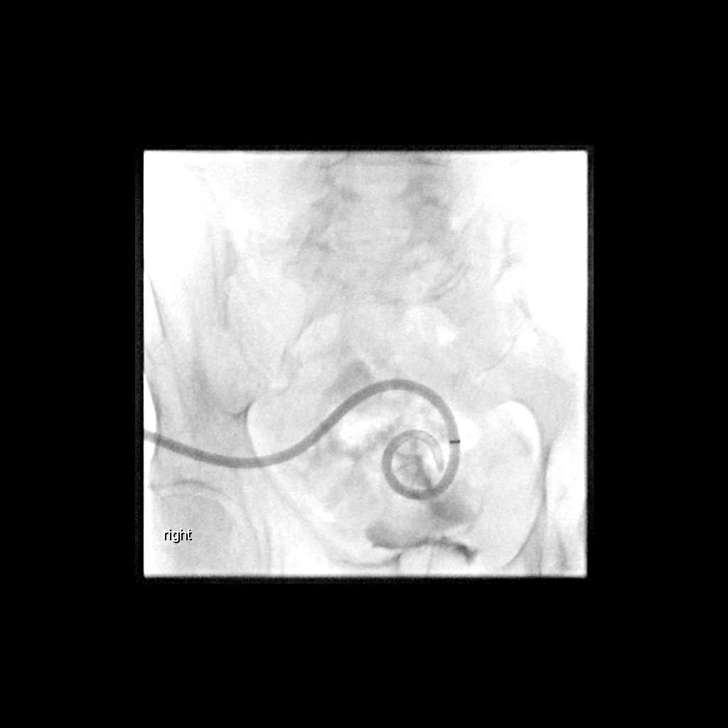
[im 5/6]
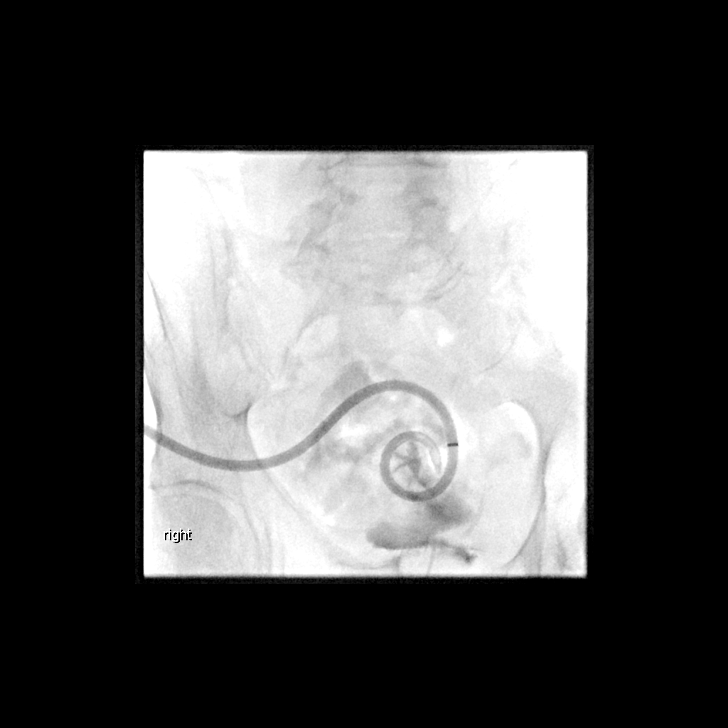
[im 6/6]
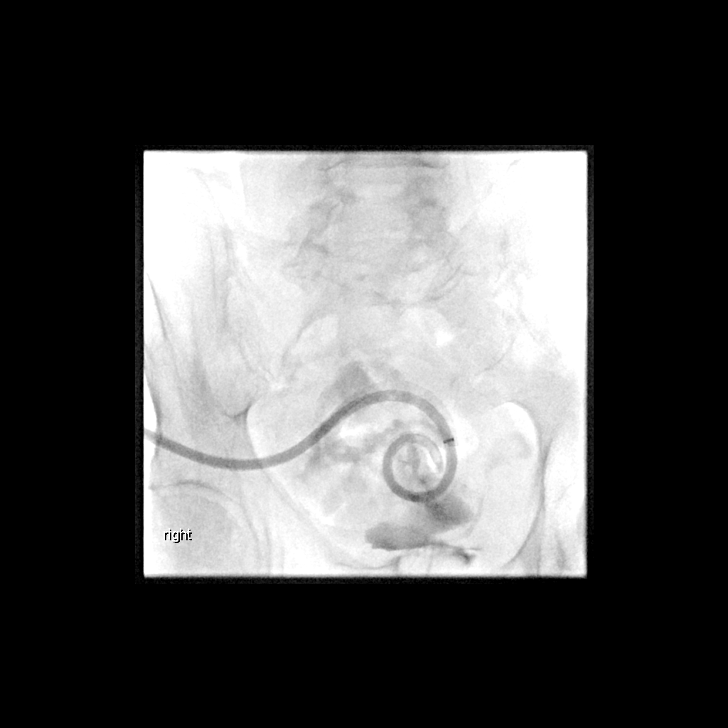

[8 of 8 positions shown; findings below may reference images not displayed]

Patient initially underwent left trans gluteal approach percutaneous
drainage catheter placement on 12/24/2015 with up sizing and
conversion to a multi side-hole biliary drainage catheter on
01/11/2016.

Patient return to the [REDACTED] earlier
today with pelvic CT demonstrating improvement though residual
presacral abscess with retraction of the percutaneous drainage
catheter with sideholes coursing outside the entrance site of the
catheter.

As such, patient returns today to the interventional [HOSPITAL]
for fluoroscopic guided percutaneous drainage catheter exchange.

EXAM:
IR CATHETER TUBE CHANGE
pelvic CT -earlier
same day ; CT abdomen pelvis - 01/09/2016

CONTRAST:  10 cc [id number] - administered via the percutaneous
drainage catheter.

MEDICATIONS:
None.

ANESTHESIA/SEDATION:
None

FLUOROSCOPY TIME:  54 seconds (1 mGy)
FINDINGS: With the patient positioned prone on the fluoroscopy table, the
external portion of the existing left trans gluteal approach
percutaneous drainage catheter as well as the surrounding skin were
prepped and draped in usual sterile fashion.

A preprocedural spot fluoroscopic image was obtained of the lower
pelvis and existing left trans gluteal approach percutaneous
drainage catheter. A small amount of contrast was injected via the
existing percutaneous drainage catheter and several fluoroscopic
images were obtained in various obliquity.

The external portion of the percutaneous drainage catheter was cut
and cannulated with a short Amplatz wire. Under intermittent
fluoroscopic guidance, the existing percutaneous drainage catheter
was exchanged for Kumpe catheter which was utilized to manipulate
the Amplatz wire into the contralateral right hemipelvis. Contrast
injection confirmed appropriate positioning.

Under intermittent fluoroscopic guidance, the Kumpe catheter
exchanged for a new 14 French percutaneous drainage catheter with
end ultimately coiled and locked within the residual presacral
abscess. Note, a standard percutaneous drainage catheter was placed
as the previous biliary catheter was retracted with sideholes
coursing external to the entrance site of the catheter.

Following successful fluoroscopic percutaneous drainage catheter
exchange, approximately 50 cc of purulent foul-smelling fluid was
aspirated.

The percutaneous drainage catheter was secured in place with 2
interrupted sutures and a StatLock device. The drain was connected
to a gravity bag. A dressing was placed. The patient tolerated the
procedure well without immediate postprocedural complication.
IMPRESSION: Successful fluoroscopic guided exchange and repositioning of a new
14 French left trans gluteal approach percutaneous drainage catheter
yielding approximately 50 cc of purulent foul-smelling fluid.
